# Patient Record
Sex: Male | Born: 1940 | Race: White | Hispanic: Yes | Marital: Married | Smoking: Never smoker
Health system: Southern US, Community
[De-identification: ages and names within clinical notes are randomized; demographics above are authoritative.]

## PROBLEM LIST (undated history)

## (undated) DIAGNOSIS — I639 Cerebral infarction, unspecified: Secondary | ICD-10-CM

## (undated) DIAGNOSIS — F0390 Unspecified dementia without behavioral disturbance: Secondary | ICD-10-CM

## (undated) HISTORY — PX: APPENDECTOMY: SHX54

---

## 2020-07-26 ENCOUNTER — Inpatient Hospital Stay
Admission: EM | Admit: 2020-07-26 | Discharge: 2020-07-30 | DRG: 193 | Disposition: A | Discharge status: Skilled Nursing Facility | Payer: Medicare Other | Attending: Internal Medicine | Admitting: Internal Medicine

## 2020-07-26 ENCOUNTER — Other Ambulatory Visit: Payer: Self-pay

## 2020-07-26 ENCOUNTER — Inpatient Hospital Stay: Payer: Medicare Other

## 2020-07-26 ENCOUNTER — Encounter: Payer: Self-pay | Admitting: Emergency Medicine

## 2020-07-26 ENCOUNTER — Emergency Department: Payer: Medicare Other

## 2020-07-26 DIAGNOSIS — A419 Sepsis, unspecified organism: Secondary | ICD-10-CM | POA: Diagnosis not present

## 2020-07-26 DIAGNOSIS — F0150 Vascular dementia without behavioral disturbance: Secondary | ICD-10-CM | POA: Diagnosis present

## 2020-07-26 DIAGNOSIS — J189 Pneumonia, unspecified organism: Secondary | ICD-10-CM | POA: Diagnosis present

## 2020-07-26 DIAGNOSIS — G9341 Metabolic encephalopathy: Secondary | ICD-10-CM | POA: Diagnosis present

## 2020-07-26 DIAGNOSIS — Z8673 Personal history of transient ischemic attack (TIA), and cerebral infarction without residual deficits: Secondary | ICD-10-CM

## 2020-07-26 DIAGNOSIS — G40909 Epilepsy, unspecified, not intractable, without status epilepticus: Secondary | ICD-10-CM | POA: Diagnosis present

## 2020-07-26 DIAGNOSIS — K219 Gastro-esophageal reflux disease without esophagitis: Secondary | ICD-10-CM | POA: Diagnosis present

## 2020-07-26 DIAGNOSIS — Z7901 Long term (current) use of anticoagulants: Secondary | ICD-10-CM | POA: Diagnosis not present

## 2020-07-26 DIAGNOSIS — I10 Essential (primary) hypertension: Secondary | ICD-10-CM | POA: Diagnosis present

## 2020-07-26 DIAGNOSIS — J432 Centrilobular emphysema: Secondary | ICD-10-CM | POA: Diagnosis present

## 2020-07-26 DIAGNOSIS — E785 Hyperlipidemia, unspecified: Secondary | ICD-10-CM | POA: Diagnosis present

## 2020-07-26 DIAGNOSIS — R1312 Dysphagia, oropharyngeal phase: Secondary | ICD-10-CM

## 2020-07-26 DIAGNOSIS — I639 Cerebral infarction, unspecified: Secondary | ICD-10-CM | POA: Diagnosis present

## 2020-07-26 DIAGNOSIS — F419 Anxiety disorder, unspecified: Secondary | ICD-10-CM | POA: Diagnosis present

## 2020-07-26 DIAGNOSIS — Z66 Do not resuscitate: Secondary | ICD-10-CM | POA: Diagnosis present

## 2020-07-26 DIAGNOSIS — Z79899 Other long term (current) drug therapy: Secondary | ICD-10-CM

## 2020-07-26 DIAGNOSIS — I480 Paroxysmal atrial fibrillation: Secondary | ICD-10-CM | POA: Diagnosis present

## 2020-07-26 DIAGNOSIS — Z20822 Contact with and (suspected) exposure to covid-19: Secondary | ICD-10-CM | POA: Diagnosis present

## 2020-07-26 DIAGNOSIS — F0390 Unspecified dementia without behavioral disturbance: Secondary | ICD-10-CM | POA: Diagnosis present

## 2020-07-26 DIAGNOSIS — I959 Hypotension, unspecified: Secondary | ICD-10-CM | POA: Diagnosis present

## 2020-07-26 DIAGNOSIS — R1310 Dysphagia, unspecified: Secondary | ICD-10-CM | POA: Diagnosis present

## 2020-07-26 DIAGNOSIS — G9340 Encephalopathy, unspecified: Secondary | ICD-10-CM | POA: Diagnosis not present

## 2020-07-26 DIAGNOSIS — F32A Depression, unspecified: Secondary | ICD-10-CM | POA: Diagnosis present

## 2020-07-26 HISTORY — DX: Unspecified dementia, unspecified severity, without behavioral disturbance, psychotic disturbance, mood disturbance, and anxiety: F03.90

## 2020-07-26 HISTORY — DX: Cerebral infarction, unspecified: I63.9

## 2020-07-26 LAB — HEPATIC FUNCTION PANEL
ALT: 15 U/L (ref 0–44)
AST: 30 U/L (ref 15–41)
Albumin: 3.5 g/dL (ref 3.5–5.0)
Alkaline Phosphatase: 79 U/L (ref 38–126)
Bilirubin, Direct: 0.2 mg/dL (ref 0.0–0.2)
Indirect Bilirubin: 0.8 mg/dL (ref 0.3–0.9)
Total Bilirubin: 1 mg/dL (ref 0.3–1.2)
Total Protein: 6.3 g/dL — ABNORMAL LOW (ref 6.5–8.1)

## 2020-07-26 LAB — URINALYSIS, COMPLETE (UACMP) WITH MICROSCOPIC
Bacteria, UA: NONE SEEN
Bilirubin Urine: NEGATIVE
Glucose, UA: NEGATIVE mg/dL
Hgb urine dipstick: NEGATIVE
Ketones, ur: NEGATIVE mg/dL
Leukocytes,Ua: NEGATIVE
Nitrite: NEGATIVE
Protein, ur: NEGATIVE mg/dL
Specific Gravity, Urine: 1.023 (ref 1.005–1.030)
Squamous Epithelial / HPF: NONE SEEN (ref 0–5)
pH: 6 (ref 5.0–8.0)

## 2020-07-26 LAB — BASIC METABOLIC PANEL
Anion gap: 7 (ref 5–15)
BUN: 13 mg/dL (ref 8–23)
CO2: 26 mmol/L (ref 22–32)
Calcium: 8.6 mg/dL — ABNORMAL LOW (ref 8.9–10.3)
Chloride: 102 mmol/L (ref 98–111)
Creatinine, Ser: 0.75 mg/dL (ref 0.61–1.24)
GFR, Estimated: >60 mL/min (ref >60)
Glucose, Bld: 102 mg/dL — ABNORMAL HIGH (ref 70–99)
Potassium: 3.7 mmol/L (ref 3.5–5.1)
Sodium: 135 mmol/L (ref 135–145)

## 2020-07-26 LAB — CBC
HCT: 41.6 % (ref 39.0–52.0)
Hemoglobin: 14 g/dL (ref 13.0–17.0)
MCH: 32.9 pg (ref 26.0–34.0)
MCHC: 33.7 g/dL (ref 30.0–36.0)
MCV: 97.7 fL (ref 80.0–100.0)
Platelets: 142 10*3/uL — ABNORMAL LOW (ref 150–400)
RBC: 4.26 MIL/uL (ref 4.22–5.81)
RDW: 12.8 % (ref 11.5–15.5)
WBC: 7.1 10*3/uL (ref 4.0–10.5)
nRBC: 0 % (ref 0.0–0.2)

## 2020-07-26 LAB — PROTIME-INR
INR: 1.9 — ABNORMAL HIGH (ref 0.8–1.2)
Prothrombin Time: 21 seconds — ABNORMAL HIGH (ref 11.4–15.2)

## 2020-07-26 LAB — APTT: aPTT: 37 seconds — ABNORMAL HIGH (ref 24–36)

## 2020-07-26 LAB — RESPIRATORY PANEL BY RT PCR (FLU A&B, COVID)
Influenza A by PCR: NEGATIVE
Influenza B by PCR: NEGATIVE
SARS Coronavirus 2 by RT PCR: NEGATIVE

## 2020-07-26 LAB — LACTIC ACID, PLASMA: Lactic Acid, Venous: 1 mmol/L (ref 0.5–1.9)

## 2020-07-26 MED ORDER — SODIUM CHLORIDE 0.9 % IV SOLN: INTRAVENOUS | Status: DC

## 2020-07-26 MED ORDER — IOHEXOL 9 MG/ML PO SOLN: 500.0000 mL | Freq: Once | ORAL | Status: DC

## 2020-07-26 MED ORDER — ONDANSETRON HCL 4 MG/2ML IJ SOLN: 4.0000 mg | Freq: Four times a day (QID) | INTRAMUSCULAR | Status: DC | PRN

## 2020-07-26 MED ORDER — ONDANSETRON HCL 4 MG PO TABS: 4.0000 mg | ORAL_TABLET | Freq: Four times a day (QID) | ORAL | Status: DC | PRN

## 2020-07-26 MED ORDER — SODIUM CHLORIDE 0.9 % IV SOLN: 2.0000 g | INTRAVENOUS | Status: DC

## 2020-07-26 MED ORDER — SODIUM CHLORIDE 0.9 % IV BOLUS (SEPSIS)
1000.0000 mL | Freq: Once | INTRAVENOUS | Status: AC
  Administered 2020-07-26: 1000 mL via INTRAVENOUS

## 2020-07-26 MED ORDER — ENOXAPARIN SODIUM 40 MG/0.4ML ~~LOC~~ SOLN
40.0000 mg | SUBCUTANEOUS | Status: DC
  Administered 2020-07-27: 40 mg via SUBCUTANEOUS
  Filled 2020-07-26: qty 0.4

## 2020-07-26 MED ORDER — SODIUM CHLORIDE 0.9 % IV SOLN
1000.0000 mL | Freq: Once | INTRAVENOUS | Status: AC
  Administered 2020-07-26: 1000 mL via INTRAVENOUS

## 2020-07-26 MED ORDER — SODIUM CHLORIDE 0.9 % IV SOLN
2.0000 g | INTRAVENOUS | Status: DC
  Administered 2020-07-26 – 2020-07-29 (×4): 2 g via INTRAVENOUS
  Filled 2020-07-26 (×2): qty 20
  Filled 2020-07-26 (×2): qty 2

## 2020-07-26 MED ORDER — ACETAMINOPHEN 650 MG RE SUPP: 650.0000 mg | Freq: Four times a day (QID) | RECTAL | Status: DC | PRN

## 2020-07-26 MED ORDER — SODIUM CHLORIDE 0.9 % IV SOLN: 500.0000 mg | INTRAVENOUS | Status: DC

## 2020-07-26 MED ORDER — LEVETIRACETAM IN NACL 500 MG/100ML IV SOLN
500.0000 mg | Freq: Two times a day (BID) | INTRAVENOUS | Status: DC
  Administered 2020-07-26 – 2020-07-27 (×2): 500 mg via INTRAVENOUS
  Filled 2020-07-26 (×4): qty 100

## 2020-07-26 MED ORDER — ACETAMINOPHEN 325 MG PO TABS: 650.0000 mg | ORAL_TABLET | Freq: Four times a day (QID) | ORAL | Status: DC | PRN

## 2020-07-26 MED ORDER — SODIUM CHLORIDE 0.9 % IV SOLN
500.0000 mg | INTRAVENOUS | Status: DC
  Administered 2020-07-26 – 2020-07-29 (×4): 500 mg via INTRAVENOUS
  Filled 2020-07-26 (×4): qty 500

## 2020-07-26 NOTE — ED Notes (Signed)
CT with oral contrast ordered. Patient NPO until cleared by speech. Ordered to be changed by MD

## 2020-07-26 NOTE — ED Triage Notes (Signed)
Pt comes into the ED via [facility] from home with his husband c/o increased weakness since yesterday.  Pt has h/o TIA and stroke with right side deficit.  Pt denies any pain, CP, SHOb, dizziness, N/V, etc.  Pt had soft pressures with EMS and continues at this time.  Pt alert and able to answer all triage questions at this time.  Husband had concern for strong urine odor as well.

## 2020-07-26 NOTE — H&P (Signed)
History and Physical    Duane Guzman ZOX:[id number] DOB: 01/16/1941 DOA: 07/26/2020  PCP: Patient, No Pcp Per   Patient coming from: Home  I have personally briefly reviewed patient's old medical records in [facility]  Chief Complaint: Weakness Most of the history was obtained from patient's spouse over the phone Duane Guzman  HPI: Duane Guzman is a 79 y.o. male with medical history significant for multiple CVA and vascular dementia who was brought into the ER by EMS for evaluation of weakness and difficulty with ambulation for about 2 days which his spouse said is atypical for him.  Over the last 24 hours prior to his admission the patient has been very weak and unable to ambulate which is far from his baseline of being able to ambulate with minimum assistance.  His spouse also states that he appears to be more confused than his baseline and on the morning of his presentation was unable to take his medication like he usually will and had water spilling out of his mouth.  He was concerned he may have had a TIA and so brought him to the emergency room for evaluation. I am unable to do a review of systems on this patient. When patient arrived to the ER he was hypotensive with blood pressure of 84/51 that responded to fluid resuscitation.  Per EMS he had a fever with a T-max of 100.65F. Labs show sodium 135, potassium 3.7, chloride 102, bicarb of 26, BUN 13, creatinine 0.75, calcium 8.6, alkaline phosphatase 79, albumin 3.5, AST 30, ALT 15, total protein 6.3, lactic acid 1.0, white count 7.1, hemoglobin 14, hematocrit 30, MCV 97.7, RDW 12.8 platelet count, PT 21, INR 1.9 Chest x-ray reviewed by me shows patchy left lung base opacity suspicious for pneumonia with a possible small left pleural effusion. Chest CT showed extensive centrilobular and paraseptal emphysematous changes.  Scattered areas of fibrosis and atelectasis.  No frank consolidation.  Sizable paraesophageal type hernia with  much of the stomach above the diaphragm.  Sending thoracic aortic diameter measuring 4.0 x 4.0 cm.  Annual imaging has been recommended. CT scan of the head without contrast shows multiple chronic infarcts. Twelve-lead EKG reviewed by me shows sinus rhythm with premature atrial complexes.   ED Course: Patient is an 79 year old male with a history of a CVA and dementia who presents to the ER for evaluation of worsening confusion to his baseline and weakness.  Chest x-ray shows a left lower lobe infiltrate concerning for possible aspiration.  He had a low-grade fever with a T-max of 100.65F, was hypotensive upon arrival to the ER with improvement in his blood pressure with IV fluid resuscitation.  He will be admitted to the hospital for further evaluation.  Review of Systems: As per HPI otherwise 10 point review of systems negative.    History reviewed. No pertinent past medical history.  Past Surgical History:  Procedure Laterality Date  . APPENDECTOMY       reports that he has never smoked. He has never used smokeless tobacco. No history on file for alcohol use and drug use.  No Known Allergies  History reviewed. No pertinent family history.   Prior to Admission medications   Not on File    Physical Exam: Vitals:   07/26/20 1100 07/26/20 1115 07/26/20 1130 07/26/20 1145  BP: (!) 81/59  100/72   Pulse: 77 75 76 80  Resp: 11 11 11 11   Temp:      TempSrc:  SpO2: 98% 98% 99% 99%  Weight:      Height:         Vitals:   07/26/20 1100 07/26/20 1115 07/26/20 1130 07/26/20 1145  BP: (!) 81/59  100/72   Pulse: 77 75 76 80  Resp: 11 11 11 11   Temp:      TempSrc:      SpO2: 98% 98% 99% 99%  Weight:      Height:        Constitutional: NAD, lethargic but opens eyes to verbal stimuli Eyes: PERRL, lids and conjunctivae pallor ENMT: Mucous membranes are moist.  Neck: normal, supple, no masses, no thyromegaly Respiratory:  Bilateral air entry, no wheezing, no crackles.  Normal respiratory effort. No accessory muscle use.  Cardiovascular: Regular rate and rhythm, no murmurs / rubs / gallops. No extremity edema. 2+ pedal pulses. No carotid bruits.  Abdomen: Diffusely tender, no masses palpated. No hepatosplenomegaly. Bowel sounds positive.  Musculoskeletal: no clubbing / cyanosis. No joint deformity upper and lower extremities.  Skin: no rashes, lesions, ulcers.  Neurologic: Unable to assess Psychiatric: Unable to assess   Labs on Admission: I have personally reviewed following labs and imaging studies  CBC: Recent Labs  Lab 07/26/20 0935  WBC 7.1  HGB 14.0  HCT 41.6  MCV 97.7  PLT 142*   Basic Metabolic Panel: Recent Labs  Lab 07/26/20 0935  NA 135  K 3.7  CL 102  CO2 26  GLUCOSE 102*  BUN 13  CREATININE 0.75  CALCIUM 8.6*   GFR: Estimated Creatinine Clearance: 66.5 mL/min (by C-G formula based on SCr of 0.75 mg/dL). Liver Function Tests: Recent Labs  Lab 07/26/20 1006  AST 30  ALT 15  ALKPHOS 79  BILITOT 1.0  PROT 6.3*  ALBUMIN 3.5   No results for input(s): LIPASE, AMYLASE in the last 168 hours. No results for input(s): AMMONIA in the last 168 hours. Coagulation Profile: Recent Labs  Lab 07/26/20 1006  INR 1.9*   Cardiac Enzymes: No results for input(s): CKTOTAL, CKMB, CKMBINDEX, TROPONINI in the last 168 hours. BNP (last 3 results) No results for input(s): PROBNP in the last 8760 hours. HbA1C: No results for input(s): HGBA1C in the last 72 hours. CBG: No results for input(s): GLUCAP in the last 168 hours. Lipid Profile: No results for input(s): CHOL, HDL, LDLCALC, TRIG, CHOLHDL, LDLDIRECT in the last 72 hours. Thyroid Function Tests: No results for input(s): TSH, T4TOTAL, FREET4, T3FREE, THYROIDAB in the last 72 hours. Anemia Panel: No results for input(s): VITAMINB12, FOLATE, FERRITIN, TIBC, IRON, RETICCTPCT in the last 72 hours. Urine analysis: No results found for: COLORURINE, APPEARANCEUR, LABSPEC, PHURINE,  GLUCOSEU, HGBUR, BILIRUBINUR, KETONESUR, PROTEINUR, UROBILINOGEN, NITRITE, LEUKOCYTESUR  Radiological Exams on Admission: DG Chest Port 1 View  Result Date: 07/26/2020 CLINICAL DATA:  Weakness, possible sepsis EXAM: PORTABLE CHEST 1 VIEW COMPARISON:  None. FINDINGS: Loop recorder overlies the lower left chest. Top-normal heart size. Atherosclerotic aortic arch. Otherwise normal mediastinal contour. No pneumothorax. No right pleural effusion. Mild blunting of the left costophrenic angle cannot exclude small left pleural effusion. Patchy left lung base opacity. No pulmonary edema. IMPRESSION: 1. Patchy left lung base opacity, suspicious for pneumonia, with differential including aspiration or atelectasis. PA and lateral chest radiograph follow-up advised. 2. Possible small left pleural effusion. Electronically Signed   By: 07/28/2020 M.D.   On: 07/26/2020 10:02    EKG: Independently reviewed.  Sinus rhythm with premature atrial complexes  Assessment/Plan Principal Problem:   Sepsis (HCC)  Active Problems:   CAP (community acquired pneumonia)   CVA (cerebral vascular accident) (HCC)   Dementia (HCC)     Sepsis (POA) As evidenced by fever with a T-max of 101F, hypotension with systolic blood pressure in the 80s that improved following IV fluid resuscitation and a left lower lobe infiltrate rule out possible aspiration We will place patient empirically on Rocephin and Zithromax Follow-up results of blood cultures We will keep patient n.p.o. and will request speech therapy for swallow function evaluation Continue IV fluid hydration    Community-acquired pneumonia Rule out possible aspiration pneumonia Patient is a high risk for aspiration due to his history of dementia as well as his paraesophageal hernia We will request speech therapy for swallow function evaluation   Vascular dementia Patient is lethargic Will hold clonazepam, citalopram, mirtazapine gabapentin and aripiprazole  until his mental status improves   Encephalopathy Unclear etiology but per patient's spouse this is far from his baseline where he is able to ambulate and carry on conversation May be secondary to infection or medication induced Hold all antidepressants and anxiolytics at this time    History of seizures Place patient on seizure precaution Continue Keppra 500 mg IV twice daily   History of CVA Chronic Hold Xarelto and atorvastatin for now until patient is more awake and alert  DVT prophylaxis: Lovenox Code Status: DO NOT RESUSCITATE Family Communication: Greater than 50% of time was spent discussing patient's condition and plan of care with his spouse over the phone.  All questions and concerns have been addressed.  He verbalizes understanding and agrees to the plan of care.  CODE STATUS was discussed and patient is a DO NOT RESUSCITATE.  He will want patient to go to subacute rehab prior to being discharged home. Disposition Plan: Back to previous home environment Consults called: Speech therapy    Rosselyn Martha MD [facility]     07/26/2020, 12:18 PM

## 2020-07-26 NOTE — Evaluation (Cosign Needed Addendum)
Clinical/Bedside Swallow Evaluation Patient Details  Name: Duane Guzman MRN: [id number] Date of Birth: [birth year]  Today's Date: 07/26/2020 Time: SLP Start Time (ACUTE ONLY): 1340 SLP Stop Time (ACUTE ONLY): 1420 SLP Time Calculation (min) (ACUTE ONLY): 40 min  Past Medical History:  Past Medical History:  Diagnosis Date  . Dementia (HCC)   . Stroke [facility])    Past Surgical History:  Past Surgical History:  Procedure Laterality Date  . APPENDECTOMY     HPI:  Pt is a 79 y.o. male with medical history significant for multiple CVA and vascular dementia who was brought into the ER by EMS for evaluation of weakness and difficulty with ambulation for about 2 days which his spouse said is atypical for him. Over the last 24 hours prior to his admission the patient has been very weak and unable to ambulate which is far from his baseline of being able to ambulate with minimum assistance.  His spouse also states that he appears to be more confused than his baseline and on the morning of his presentation was unable to take his medication like he usually will and had water spilling out of his mouth.  He was concerned he may have had a TIA and so brought him to the emergency room for evaluation.   Chest x-ray (10/28)  shows patchy left lung base opacity suspicious for pneumonia with a possible small left pleural effusion. Chest CT (10/28) showed extensive centrilobular and paraseptal emphysematous changes.  Scattered areas of fibrosis and atelectasis.  No frank consolidation.  Sizable paraesophageal type hernia with much of the stomach above the diaphragm.   CT scan of the head without contrast (10/28) shows multiple chronic infarcts.  Assessment / Plan / Recommendation Clinical Impression  Pt was seen bedside in the ED for bedside swallow evaluation. Pt presents w/ No immediate overt clinical s/s of aspiration w/ po trials given. Pt is at min increased risk for dysphagia d/t cognitive status (noted in  chart-- "multiple chronic infarcts"; vascular Dementia), and noted overall deliberate, slow motor movements, which impact Oral-prep stage and Oral stage of swallowing. Of note, any Oral Phase Dysphagia can increase risk for Pharyngeal Phase Dysphagia. Pt has Baseline Motor and Speech deficits s/p previous L MCA CVA; contracted RUE.   Pt was awake, alert, and verbal throughout session-- however, he appeared confused and frequently stated that he felt "that he is in the wrong room". Min-mod verbal cues required to re-orient pt to task; distractions reduced as possible. Oral mech exam revealed pt to be missing some dentition; lingual strength & ROM WFL; labial seal adequate. Pt exhibited fair volitional cough. Pt vocal quality appears normal baseline. Given 10x ice chips via spoon, 8x thin liquid via cup(multiple sips noted), 8x puree via spoon, and 8x softened solids via spoon/hand, pt exhibited No immediate, overt clinical s/s of aspiration -- O2 sats remained 100%, RR 18, HR wnl. Pt required moderate assist for feeding-- full assist for upright positioning in bed. Pt able to self-feed thin liquids Holding Cup, and some softened solids (moistened graham crackers). Of note, pt appears to have contracted R arm, further emphasizing need for feeding support at mealtime. Oral-prep stage appears min impaired d/t pt's generalized slow, deliberate oral motor movements-- however, given min verbal cues to attend to task & TIME, oral-prep is [facility]. Oral phase appears min impaired d/t min delay in A-P transfer of bolus; mastication time w/ increased texture min increased. Adequate labial seal; complete clearance of all bolus consistencies w/ Time  noted. Pt vocal quality was clear during/between all PO trials. No overt Pharyngeal Phase s/s noted-- no cough, no increased WOB/SOB-- O2 sats remained in the 99-100% range during all po trials. Pt was responsive to verbal cues to attend to, and follow through w/ swallow, as well as  followed clinicians directions in a timely manner.   Given pt's Baseline motor and speech deficits s/p previous L MCA, few missing dentition, Baseline Cognitive decline/Dementia status, and overall delayed motoric movements which appear to be impacting Oral Phase of swallow, recommend Dysphagia 3 w/ Minced Meats/gravies diet w/ thin liquids VIA CUP -- NO STRAWS. Meds in puree/applesauce (whole vs. crushed per NSG). NO STRAWS -- cup drinking beneficial to increase pt oral awareness. This diet may also increase nutritional intake for pt, due to the reduced motoric demands inherent to Mech soft diet vs. Regular(mastication effort/time). Recommend following general aspiration precautions w/ emphasis on reducing distractions, NO straws, alternating solids/liquids for complete clearance of bolus, and verbally cueing pt to attend to task as needed. MD/NSG updated. ST services to f/u toleration of diet. Aspiration Precautions/education posted in room.    SLP Visit Diagnosis: Dysphagia, unspecified (R13.10)    Aspiration Risk  Mild aspiration risk    Diet Recommendation  Dysphagia level 3 (mech soft w/ MINCED meats, gravies); Thin liquids VIA CUP -- NO STRAWS. Aspiration precautions; feeding support and setup at meals d/t RUE weakness, Cognitive decline  Medication Administration: Whole meds with puree vs need for Crushed in puree   Other  Recommendations Oral Care Recommendations: Oral care BID; before/post meals; assisted by NSG staff  Follow up Recommendations Home health SLP;Outpatient SLP;Skilled Nursing facility TBD     Frequency and Duration min 2x/week  1 week       Prognosis Prognosis for Safe Diet Advancement: Fair Barriers to Reach Goals: Cognitive deficits;Time post onset;Severity of deficits;Behavior      Swallow Study   General Date of Onset: 07/26/20 HPI: Pt is a 79 y.o. male with medical history significant for multiple CVA and vascular dementia who was brought into the ER by  EMS for evaluation of weakness and difficulty with ambulation for about 2 days which his spouse said is atypical for him.  Over the last 24 hours prior to his admission the patient has been very weak and unable to ambulate which is far from his baseline of being able to ambulate with minimum assistance.  His spouse also states that he appears to be more confused than his baseline and on the morning of his presentation was unable to take his medication like he usually will and had water spilling out of his mouth.  He was concerned he may have had a TIA and so brought him to the emergency room for evaluation. Type of Study: Bedside Swallow Evaluation Previous Swallow Assessment: n/a Diet Prior to this Study: NPO Temperature Spikes Noted: No Respiratory Status: Nasal cannula History of Recent Intubation: No Behavior/Cognition: Alert;Cooperative;Pleasant mood;Confused;Distractible;Requires cueing Oral Cavity Assessment: Within Functional Limits (possibly oral thrush) Oral Care Completed by SLP: No Oral Cavity - Dentition: Poor condition;Missing dentition (missing upper teeth) Vision: Functional for self-feeding Self-Feeding Abilities: Able to feed self;Needs assist;Needs set up Patient Positioning: Upright in bed Baseline Vocal Quality: Normal Volitional Cough: Strong (fair)    Oral/Motor/Sensory Function Overall Oral Motor/Sensory Function: Mild impairment Facial ROM: Within Functional Limits Facial Symmetry: Within Functional Limits Facial Strength: Within Functional Limits Facial Sensation: Within Functional Limits Lingual ROM: Within Functional Limits Lingual Symmetry: Within Functional Limits Lingual  Strength: Within Functional Limits Lingual Sensation: Within Functional Limits Velum: Within Functional Limits Mandible: Within Functional Limits   Ice Chips Ice chips: Within functional limits Presentation: Spoon Other Comments: 10x   Thin Liquid Thin Liquid: Within functional  limits Presentation: Straw Other Comments: 8x    Nectar Thick Nectar Thick Liquid: Not tested   Honey Thick Honey Thick Liquid: Not tested   Puree Puree: Within functional limits Presentation: Spoon Other Comments: 8x   Solid     Solid: Within functional limits Presentation: Self Fed;Spoon Other Comments: 8x      Oliver Pila  Graduate Clinician 07/26/2020,3:27 PM   The information in this patient note, response to treatment, and overall treatment plan developed has been reviewed and agreed upon by this clinician.  Jerilynn Som, MS, CCC-SLP Speech Language Pathologist Rehab Services [phone]

## 2020-07-26 NOTE — ED Provider Notes (Signed)
[facility] Emergency Department Provider Note   ____________________________________________    I have reviewed the triage vital signs and the nursing notes.   HISTORY  Chief Complaint Weakness   History limited by patient's altered mental status/dementia  HPI Duane Guzman is a 79 y.o. male with a history of CVA, dementia who presents with worsening weakness over the last several days per husband.  Husband reports he was quite weak and unable to walk 2 days ago which is atypical for him.  Has seemed more confused in the last several days as well.  No reports of fevers or chills.  Possible cough.  No vomiting, no reports of foul-smelling urine.  No new medications reported   History reviewed. No pertinent past medical history.  There are no problems to display for this patient.   Past Surgical History:  Procedure Laterality Date  . APPENDECTOMY      Prior to Admission medications   Not on File     Allergies Patient has no known allergies.  History reviewed. No pertinent family history.  Social History Social History   Tobacco Use  . Smoking status: Never Smoker  . Smokeless tobacco: Never Used  Substance Use Topics  . Alcohol use: Not on file  . Drug use: Not on file  No alcohol or drug use  Review of Systems  Constitutional: No reports of fever Eyes: No discharge ENT: No congestion Cardiovascular: Denies chest pain. Respiratory: No cough Gastrointestinal: No vomiting Genitourinary: Possible strong smelling urine Musculoskeletal: No joint swelling Skin: Negative for rash. Neurological: Negative for new focal weakness   ____________________________________________   PHYSICAL EXAM:  VITAL SIGNS: ED Triage Vitals  Enc Vitals Group     BP 07/26/20 0924 (!) 84/51     Pulse Rate 07/26/20 0924 92     Resp 07/26/20 0924 17     Temp 07/26/20 0924 98.2 F (36.8 C)     Temp Source 07/26/20 0924 Oral     SpO2 07/26/20  0924 92 %     Weight 07/26/20 0930 65.8 kg (145 lb)     Height 07/26/20 0930 1.676 m (5\' 6" )     Head Circumference --      Peak Flow --      Pain Score 07/26/20 0930 0     Pain Loc --      Pain Edu? --      Excl. in GC? --     Constitutional: Alert but disoriented Eyes: Conjunctivae are normal.  Head: Atraumatic. Nose: No congestion/rhinnorhea. Mouth/Throat: Mucous membranes are moist.   Neck:  Painless ROM Cardiovascular: Normal rate, regular rhythm.  Good peripheral circulation. Respiratory: Normal respiratory effort.  No retractions. Lungs CTAB. Gastrointestinal: Soft and nontender. No distention.  No CVA tenderness.  Musculoskeletal:   Warm and well perfused Neurologic:   No gross focal neurologic deficits are appreciated.  Skin:  Skin is warm, dry and intact. No rash noted.   ____________________________________________   LABS (all labs ordered are listed, but only abnormal results are displayed)  Labs Reviewed  BASIC METABOLIC PANEL - Abnormal; Notable for the following components:      Result Value   Glucose, Bld 102 (*)    Calcium 8.6 (*)    All other components within normal limits  CBC - Abnormal; Notable for the following components:   Platelets 142 (*)    All other components within normal limits  PROTIME-INR - Abnormal; Notable for the following components:  Prothrombin Time 21.0 (*)    INR 1.9 (*)    All other components within normal limits  APTT - Abnormal; Notable for the following components:   aPTT 37 (*)    All other components within normal limits  HEPATIC FUNCTION PANEL - Abnormal; Notable for the following components:   Total Protein 6.3 (*)    All other components within normal limits  CULTURE, BLOOD (SINGLE)  URINE CULTURE  RESPIRATORY PANEL BY RT PCR (FLU A&B, COVID)  CULTURE, BLOOD (ROUTINE X 2)  CULTURE, BLOOD (ROUTINE X 2)  LACTIC ACID, PLASMA  URINALYSIS, COMPLETE (UACMP) WITH MICROSCOPIC  LACTIC ACID, PLASMA    ____________________________________________  EKG  ED ECG REPORT I, Jene Every, the attending physician, personally viewed and interpreted this ECG.  Date: 07/26/2020  Rhythm: normal sinus rhythm QRS Axis: normal Intervals: normal ST/T Wave abnormalities: normal Narrative Interpretation: no evidence of acute ischemia  ____________________________________________  RADIOLOGY  Chest x-ray viewed by me, left-sided infiltrate, confirmed by radiology ____________________________________________   PROCEDURES  Procedure(s) performed: No  Procedures   Critical Care performed: No ____________________________________________   INITIAL IMPRESSION / ASSESSMENT AND PLAN / ED COURSE  Pertinent labs & imaging results that were available during my care of the patient were reviewed by me and considered in my medical decision making (see chart for details).  Patient presents with gradual worsening weakness, increased confusion per partner.  Afebrile here in the emergency department but suspicious for infectious etiology with metabolic encephalopathy.  No focal neuro deficits to suggest CVA.  Lab work is overall reassuring, normal white blood cell count, normal lactic acid.  Chest x-ray reviewed by me, demonstrates left-sided infiltrate consistent with pneumonia, I suspect this is the cause of his weakness and altered mental status.  We will start Rocephin, azithromycin, code sepsis activated  Have ordered 30 mils per kilogram of IV fluids  Sepsis - Repeat Assessment  Performed at:    1150  Vitals     Blood pressure 95/65, pulse 80, temperature 98.2 F (36.8 C), temperature source Oral, resp. rate 10, height 1.676 m (5\' 6" ), weight 65.8 kg, SpO2 96 %.  Heart:     Regular rate and rhythm  Lungs:    CTA  Capillary Refill:   <2 sec  Peripheral Pulse:   Radial pulse palpable  Skin:     Normal Color     We will admit to the hospitalist service.     ____________________________________________   FINAL CLINICAL IMPRESSION(S) / ED DIAGNOSES  Final diagnoses:  Community acquired pneumonia of left lung, unspecified part of lung  Acute metabolic encephalopathy        Note:  This document was prepared using Dragon voice recognition software and may include unintentional dictation errors.   , MD 07/26/20 1152

## 2020-07-26 NOTE — Plan of Care (Signed)

## 2020-07-26 NOTE — Progress Notes (Signed)
CODE SEPSIS - PHARMACY COMMUNICATION  **Broad Spectrum Antibiotics should be administered within 1 hour of Sepsis diagnosis**  Time Code Sepsis Called/Page Received: @ 1020  Antibiotics Ordered: ceftriaxone and Azithromycin   Time of 1st antibiotic administration: @ 1044  Additional action taken by pharmacy: N/A  If necessary, Name of Provider/Nurse Contacted: N/A   Gardner Candle, PharmD, BCPS Clinical Pharmacist 07/26/2020 10:21 AM

## 2020-07-26 NOTE — ED Notes (Addendum)
Interpreter requested. BIB [facility] from home due to increased weakness beginning yesterday with questionable facial droop reported by family. Pt with hx of TIA and right side flaccid/contracted at baseline per EMS. 100.1 temp with EMS, 96 on RA, HR of 70, 100/54 BP. Allergy to shrimp. Hx of dementia. Awaiting partner. EMS denies any facial droop currently.

## 2020-07-27 ENCOUNTER — Encounter: Payer: Self-pay | Admitting: Internal Medicine

## 2020-07-27 DIAGNOSIS — G9340 Encephalopathy, unspecified: Secondary | ICD-10-CM

## 2020-07-27 LAB — BLOOD CULTURE ID PANEL (REFLEXED) - BCID2

## 2020-07-27 LAB — URINE CULTURE: Culture: NO GROWTH

## 2020-07-27 MED ORDER — MIRTAZAPINE 7.5 MG PO TABS
3.7500 mg | ORAL_TABLET | Freq: Every day | ORAL | Status: DC
  Administered 2020-07-28 – 2020-07-30 (×3): 3.75 mg via ORAL
  Filled 2020-07-27 (×3): qty 1

## 2020-07-27 MED ORDER — LEVETIRACETAM 500 MG PO TABS
500.0000 mg | ORAL_TABLET | Freq: Every morning | ORAL | Status: DC
  Administered 2020-07-27 – 2020-07-30 (×4): 500 mg via ORAL
  Filled 2020-07-27 (×4): qty 1

## 2020-07-27 MED ORDER — CITALOPRAM HYDROBROMIDE 20 MG PO TABS: 20.0000 mg | ORAL_TABLET | Freq: Every day | ORAL | Status: DC

## 2020-07-27 MED ORDER — PANTOPRAZOLE SODIUM 40 MG PO TBEC
40.0000 mg | DELAYED_RELEASE_TABLET | Freq: Every day | ORAL | Status: DC
  Administered 2020-07-28 – 2020-07-30 (×3): 40 mg via ORAL
  Filled 2020-07-27 (×3): qty 1

## 2020-07-27 MED ORDER — GABAPENTIN 300 MG PO CAPS
300.0000 mg | ORAL_CAPSULE | Freq: Every evening | ORAL | Status: DC
  Administered 2020-07-27 – 2020-07-28 (×2): 300 mg via ORAL
  Filled 2020-07-27 (×3): qty 1

## 2020-07-27 MED ORDER — VITAMIN D (ERGOCALCIFEROL) 1.25 MG (50000 UNIT) PO CAPS
50000.0000 [IU] | ORAL_CAPSULE | ORAL | Status: DC
  Administered 2020-07-30: 50000 [IU] via ORAL
  Filled 2020-07-27: qty 1

## 2020-07-27 MED ORDER — ARIPIPRAZOLE 5 MG PO TABS
2.5000 mg | ORAL_TABLET | Freq: Every day | ORAL | Status: DC
  Administered 2020-07-27 – 2020-07-30 (×4): 2.5 mg via ORAL
  Filled 2020-07-27 (×4): qty 1

## 2020-07-27 MED ORDER — ARIPIPRAZOLE 5 MG PO TABS: 2.5000 mg | ORAL_TABLET | Freq: Every day | ORAL | Status: DC

## 2020-07-27 MED ORDER — ATORVASTATIN CALCIUM 10 MG PO TABS
10.0000 mg | ORAL_TABLET | Freq: Every day | ORAL | Status: DC
  Administered 2020-07-27 – 2020-07-29 (×3): 10 mg via ORAL
  Filled 2020-07-27 (×3): qty 1

## 2020-07-27 MED ORDER — RIVAROXABAN 20 MG PO TABS
20.0000 mg | ORAL_TABLET | Freq: Every evening | ORAL | Status: DC
  Administered 2020-07-27 – 2020-07-28 (×2): 20 mg via ORAL
  Filled 2020-07-27 (×3): qty 1

## 2020-07-27 MED ORDER — CITALOPRAM HYDROBROMIDE 20 MG PO TABS
20.0000 mg | ORAL_TABLET | Freq: Every day | ORAL | Status: DC
  Administered 2020-07-27 – 2020-07-30 (×4): 20 mg via ORAL
  Filled 2020-07-27 (×4): qty 1

## 2020-07-27 MED ORDER — LEVETIRACETAM 500 MG PO TABS
1000.0000 mg | ORAL_TABLET | Freq: Every day | ORAL | Status: DC
  Administered 2020-07-27 – 2020-07-29 (×3): 1000 mg via ORAL
  Filled 2020-07-27 (×3): qty 2

## 2020-07-27 NOTE — Plan of Care (Signed)

## 2020-07-27 NOTE — Progress Notes (Signed)
PHARMACY - PHYSICIAN COMMUNICATION CRITICAL VALUE ALERT - BLOOD CULTURE IDENTIFICATION (BCID)  Duane Guzman is an 79 y.o. male who presented to [facility] on [facility]/26/2020 with a chief complaint of weakness  Assessment:  Lab reports 1 of 2 bottles + for staph species, Staph Epi mecA/C detected  Name of physician (or Provider) Contacted: Reyes Ivan NP  Current antibiotics: Zithromax and Rocephin, WBC normal, afebrile  Changes to prescribed antibiotics recommended: possible contamination, no changes recommended, continue to monitor for possible adjustment, no reports of fever or chills Recommendations accepted by provider  Results for orders placed or performed during the hospital encounter of 07/26/20  Blood Culture ID Panel (Reflexed) (Collected: 07/26/2020 10:06 AM)  Result Value Ref Range   Enterococcus faecalis NOT DETECTED NOT DETECTED   Enterococcus Faecium NOT DETECTED NOT DETECTED   Listeria monocytogenes NOT DETECTED NOT DETECTED   Staphylococcus species DETECTED (A) NOT DETECTED   Staphylococcus aureus (BCID) NOT DETECTED NOT DETECTED   Staphylococcus epidermidis DETECTED (A) NOT DETECTED   Staphylococcus lugdunensis NOT DETECTED NOT DETECTED   Streptococcus species NOT DETECTED NOT DETECTED   Streptococcus agalactiae NOT DETECTED NOT DETECTED   Streptococcus pneumoniae NOT DETECTED NOT DETECTED   Streptococcus pyogenes NOT DETECTED NOT DETECTED   A.calcoaceticus-baumannii NOT DETECTED NOT DETECTED   Bacteroides fragilis NOT DETECTED NOT DETECTED   Enterobacterales NOT DETECTED NOT DETECTED   Enterobacter cloacae complex NOT DETECTED NOT DETECTED   Escherichia coli NOT DETECTED NOT DETECTED   Klebsiella aerogenes NOT DETECTED NOT DETECTED   Klebsiella oxytoca NOT DETECTED NOT DETECTED   Klebsiella pneumoniae NOT DETECTED NOT DETECTED   Proteus species NOT DETECTED NOT DETECTED   Salmonella species NOT DETECTED NOT DETECTED   Serratia marcescens NOT DETECTED NOT DETECTED    Haemophilus influenzae NOT DETECTED NOT DETECTED   Neisseria meningitidis NOT DETECTED NOT DETECTED   Pseudomonas aeruginosa NOT DETECTED NOT DETECTED   Stenotrophomonas maltophilia NOT DETECTED NOT DETECTED   Candida albicans NOT DETECTED NOT DETECTED   Candida auris NOT DETECTED NOT DETECTED   Candida glabrata NOT DETECTED NOT DETECTED   Candida krusei NOT DETECTED NOT DETECTED   Candida parapsilosis NOT DETECTED NOT DETECTED   Candida tropicalis NOT DETECTED NOT DETECTED   Cryptococcus neoformans/gattii NOT DETECTED NOT DETECTED   Methicillin resistance mecA/C DETECTED (A) NOT DETECTED    Valrie Hart A 07/27/2020  6:45 AM

## 2020-07-27 NOTE — Progress Notes (Addendum)
Progress Note    Duane LynnRafael Guzman  EAV:[id number]:[id number] DOB: August 26, 1941  DOA: 07/26/2020 PCP: Patient, No Pcp Per      Brief Narrative:    Medical records reviewed and are as summarized below:  Duane Guzman is a 79 y.o. male       Assessment/Plan:   Principal Problem:   Sepsis (HCC) Active Problems:   CAP (community acquired pneumonia)   CVA (cerebral vascular accident) (HCC)   Dementia (HCC)   Encephalopathy acute   Body mass index is 23.92 kg/m.   Sepsis secondary to pneumonia: Continue empiric IV antibiotics.  1 out of 4 blood culture bottles showed staph epidermidis.  This is likely a contaminant.  Repeat blood culture tomorrow.  Discontinue IV fluids.  Acute metabolic encephalopathy with underlying dementia: Supportive care.  Continue psychotropics  Paroxysmal atrial fibrillation and history of stroke: Continue Xarelto  Seizure disorder: Continue Keppra and gabapentin  Dysphagia: Speech therapist recommended dysphagia 3 diet  Other comorbidities include hypertension, hyperlipidemia, depression, anxiety, GERD, hiatal hernia.    Diet Order            DIET DYS 3 Room service appropriate? Yes with Assist; Fluid consistency: Thin  Diet effective now                    Consultants:  None  Procedures:  None    Medications:   . ARIPiprazole  2.5 mg Oral Q1200  . atorvastatin  10 mg Oral QHS  . citalopram  20 mg Oral Daily  . gabapentin  300 mg Oral QPM  . iohexol  500 mL Oral Once  . levETIRAcetam  500-1,000 mg Oral See admin instructions  . [START ON 07/28/2020] mirtazapine  3.75 mg Oral Daily  . [START ON 07/28/2020] pantoprazole  40 mg Oral Q0600  . rivaroxaban  20 mg Oral QPM  . [START ON 07/30/2020] Vitamin D (Ergocalciferol)  50,000 Units Oral Q Mon   Continuous Infusions: . azithromycin 500 mg (07/27/20 1120)  . cefTRIAXone (ROCEPHIN)  IV 2 g (07/27/20 1029)     Anti-infectives (From admission, onward)   Start     Dose/Rate  Route Frequency Ordered Stop   07/27/20 1100  cefTRIAXone (ROCEPHIN) 2 g in sodium chloride 0.9 % 100 mL IVPB  Status:  Discontinued        2 g 200 mL/hr over 30 Minutes Intravenous Every 24 hours 07/26/20 1404 07/26/20 1408   07/27/20 1100  azithromycin (ZITHROMAX) 500 mg in sodium chloride 0.9 % 250 mL IVPB  Status:  Discontinued        500 mg 250 mL/hr over 60 Minutes Intravenous Every 24 hours 07/26/20 1404 07/26/20 1408   07/26/20 1030  cefTRIAXone (ROCEPHIN) 2 g in sodium chloride 0.9 % 100 mL IVPB        2 g 200 mL/hr over 30 Minutes Intravenous Every 24 hours 07/26/20 1018     07/26/20 1030  azithromycin (ZITHROMAX) 500 mg in sodium chloride 0.9 % 250 mL IVPB        500 mg 250 mL/hr over 60 Minutes Intravenous Every 24 hours 07/26/20 1018               Family Communication/Anticipated D/C date and plan/Code Status   DVT prophylaxis:  rivaroxaban (XARELTO) tablet 20 mg     Code Status: DNR  Family Communication:  Disposition Plan:    Status is: Inpatient  Remains inpatient appropriate because:IV treatments appropriate due to intensity of illness  or inability to take PO and Inpatient level of care appropriate due to severity of illness   Dispo: The patient is from: Home              Anticipated d/c is to: Home              Anticipated d/c date is: 3 days              Patient currently is not medically stable to d/c.           Subjective:   Interval events noted.  He is confused and unable to provide any history.  Objective:    Vitals:   07/27/20 0005 07/27/20 0427 07/27/20 0821 07/27/20 1128  BP: 117/71 121/76 129/85 118/74  Pulse: 68 72 72 76  Resp: 16 18 19 18   Temp: 97.6 F (36.4 C) 97.9 F (36.6 C) 98 F (36.7 C) 97.9 F (36.6 C)  TempSrc: Oral Oral    SpO2: 100% 97% 97% 95%  Weight:  67.2 kg    Height:       No data found.   Intake/Output Summary (Last 24 hours) at 07/27/2020 1425 Last data filed at 07/27/2020 0537 Gross per  24 hour  Intake 1364.49 ml  Output 50 ml  Net 1314.49 ml   Filed Weights   07/26/20 0930 07/27/20 0427  Weight: 65.8 kg 67.2 kg    Exam:  GEN: NAD SKIN: Warm and dry  EYES: No pallor or icterus ENT: MMM CV: RRR PULM: CTA B ABD: soft, ND, NT, +BS CNS: AAO x 1 (person), right upper extremity weakness with contractures.  He moves all other extremities spontaneously.  He is confused and does not follow commands. EXT: No edema or tenderness   Data Reviewed:   I have personally reviewed following labs and imaging studies:  Labs: Labs show the following:   Basic Metabolic Panel: Recent Labs  Lab 07/26/20 0935  NA 135  K 3.7  CL 102  CO2 26  GLUCOSE 102*  BUN 13  CREATININE 0.75  CALCIUM 8.6*   GFR Estimated Creatinine Clearance: 66.5 mL/min (by C-G formula based on SCr of 0.75 mg/dL). Liver Function Tests: Recent Labs  Lab 07/26/20 1006  AST 30  ALT 15  ALKPHOS 79  BILITOT 1.0  PROT 6.3*  ALBUMIN 3.5   No results for input(s): LIPASE, AMYLASE in the last 168 hours. No results for input(s): AMMONIA in the last 168 hours. Coagulation profile Recent Labs  Lab 07/26/20 1006  INR 1.9*    CBC: Recent Labs  Lab 07/26/20 0935  WBC 7.1  HGB 14.0  HCT 41.6  MCV 97.7  PLT 142*   Cardiac Enzymes: No results for input(s): CKTOTAL, CKMB, CKMBINDEX, TROPONINI in the last 168 hours. BNP (last 3 results) No results for input(s): PROBNP in the last 8760 hours. CBG: No results for input(s): GLUCAP in the last 168 hours. D-Dimer: No results for input(s): DDIMER in the last 72 hours. Hgb A1c: No results for input(s): HGBA1C in the last 72 hours. Lipid Profile: No results for input(s): CHOL, HDL, LDLCALC, TRIG, CHOLHDL, LDLDIRECT in the last 72 hours. Thyroid function studies: No results for input(s): TSH, T4TOTAL, T3FREE, THYROIDAB in the last 72 hours.  Invalid input(s): FREET3 Anemia work up: No results for input(s): VITAMINB12, FOLATE, FERRITIN,  TIBC, IRON, RETICCTPCT in the last 72 hours. Sepsis Labs: Recent Labs  Lab 07/26/20 0935 07/26/20 1006  WBC 7.1  --   LATICACIDVEN  --  1.0    Microbiology Recent Results (from the past 240 hour(s))  Urine culture     Status: None   Collection Time: 07/26/20  9:35 AM   Specimen: In/Out Cath Urine  Result Value Ref Range Status   Specimen Description   Final    IN/OUT CATH URINE Performed at [facility], [address] [place], [place] [address]    Special Requests   Final    NONE Performed at [facility], [address] [place], [place] [address]    Culture   Final    NO GROWTH Performed at [facility], [address], [place], [place] [address]    Report Status 07/27/2020 FINAL  Final  Blood culture (routine single)     Status: None (Preliminary result)   Collection Time: 07/26/20 10:06 AM   Specimen: BLOOD  Result Value Ref Range Status   Specimen Description BLOOD BLOOD RIGHT HAND  Final   Special Requests   Final    BOTTLES DRAWN AEROBIC AND ANAEROBIC Blood Culture results may not be optimal due to an inadequate volume of blood received in culture bottles   Culture  Setup Time   Final    GRAM POSITIVE COCCI ANAEROBIC BOTTLE ONLY Organism ID to follow CRITICAL RESULT CALLED TO, READ BACK BY AND VERIFIED WITH: SCOTT HALL AT 0631 ON 07/27/2020 [facility]. Performed at [facility], [address] [place], [place] [address]    Culture [facility] POSITIVE COCCI  Final   Report Status PENDING  Incomplete  Blood culture (routine x 2)     Status: None (Preliminary result)   Collection Time: 07/26/20 10:06 AM   Specimen: BLOOD  Result Value Ref Range Status   Specimen Description BLOOD LEFT ANTECUBITAL  Final   Special Requests   Final    BOTTLES DRAWN AEROBIC AND ANAEROBIC Blood Culture adequate volume   Culture   Final    NO GROWTH < 24 HOURS Performed at [facility], [address], [place], [place] [address]    Report  Status PENDING  Incomplete  Blood Culture ID Panel (Reflexed)     Status: Abnormal   Collection Time: 07/26/20 10:06 AM  Result Value Ref Range Status   Enterococcus faecalis NOT DETECTED NOT DETECTED Final   Enterococcus Faecium NOT DETECTED NOT DETECTED Final   Listeria monocytogenes NOT DETECTED NOT DETECTED Final   Staphylococcus species DETECTED (A) NOT DETECTED Final    Comment: CRITICAL RESULT CALLED TO, READ BACK BY AND VERIFIED WITH: SCOTT HALL AT 0631 ON 07/27/2020 [facility].    Staphylococcus aureus (BCID) NOT DETECTED NOT DETECTED Final   Staphylococcus epidermidis DETECTED (A) NOT DETECTED Final    Comment: Methicillin (oxacillin) resistant coagulase negative staphylococcus. Possible blood culture contaminant (unless isolated from more than one blood culture draw or clinical case suggests pathogenicity). No antibiotic treatment is indicated for blood  culture contaminants. CRITICAL RESULT CALLED TO, READ BACK BY AND VERIFIED WITH: SCOTT HALL AT 0631 ON 07/27/2020 [facility].    Staphylococcus lugdunensis NOT DETECTED NOT DETECTED Final   Streptococcus species NOT DETECTED NOT DETECTED Final   Streptococcus agalactiae NOT DETECTED NOT DETECTED Final   Streptococcus pneumoniae NOT DETECTED NOT DETECTED Final   Streptococcus pyogenes NOT DETECTED NOT DETECTED Final   A.calcoaceticus-baumannii NOT DETECTED NOT DETECTED Final   Bacteroides fragilis NOT DETECTED NOT DETECTED Final   Enterobacterales NOT DETECTED NOT DETECTED Final   Enterobacter cloacae complex NOT DETECTED NOT DETECTED Final   Escherichia coli  NOT DETECTED NOT DETECTED Final   Klebsiella aerogenes NOT DETECTED NOT DETECTED Final   Klebsiella oxytoca NOT DETECTED NOT DETECTED Final   Klebsiella pneumoniae NOT DETECTED NOT DETECTED Final   Proteus species NOT DETECTED NOT DETECTED Final   Salmonella species NOT DETECTED NOT DETECTED Final   Serratia marcescens NOT DETECTED NOT DETECTED Final   Haemophilus influenzae NOT  DETECTED NOT DETECTED Final   Neisseria meningitidis NOT DETECTED NOT DETECTED Final   Pseudomonas aeruginosa NOT DETECTED NOT DETECTED Final   Stenotrophomonas maltophilia NOT DETECTED NOT DETECTED Final   Candida albicans NOT DETECTED NOT DETECTED Final   Candida auris NOT DETECTED NOT DETECTED Final   Candida glabrata NOT DETECTED NOT DETECTED Final   Candida krusei NOT DETECTED NOT DETECTED Final   Candida parapsilosis NOT DETECTED NOT DETECTED Final   Candida tropicalis NOT DETECTED NOT DETECTED Final   Cryptococcus neoformans/gattii NOT DETECTED NOT DETECTED Final   Methicillin resistance mecA/C DETECTED (A) NOT DETECTED Final    Comment: CRITICAL RESULT CALLED TO, READ BACK BY AND VERIFIED WITH: SCOTT HALL AT 0631 ON 07/27/2020 [facility]. Performed at [facility] [address] [place], [place] [address]   Respiratory Panel by RT PCR (Flu A&B, Covid) -     Status: None   Collection Time: 07/26/20 12:01 PM   Specimen: Nasopharyngeal  Result Value Ref Range Status   SARS Coronavirus 2 by RT PCR NEGATIVE NEGATIVE Final    Comment: (NOTE) SARS-CoV-2 target nucleic acids are NOT DETECTED.  The SARS-CoV-2 RNA is generally detectable in upper respiratoy specimens during the acute phase of infection. The lowest concentration of SARS-CoV-2 viral copies this assay can detect is 131 copies/mL. A negative result does not preclude SARS-Cov-2 infection and should not be used as the sole basis for treatment or other patient management decisions. A negative result may occur with  improper specimen collection/handling, submission of specimen other than nasopharyngeal swab, presence of viral mutation(s) within the areas targeted by this assay, and inadequate number of viral copies (<131 copies/mL). A negative result must be combined with clinical observations, patient history, and epidemiological information. The expected result is Negative.  Fact Sheet for Patients:    https://www.moore.com/  Fact Sheet for Healthcare Providers:  https://www.young.biz/  This test is no t yet approved or cleared by the [place] FDA and  has been authorized for detection and/or diagnosis of SARS-CoV-2 by FDA under an Emergency Use Authorization (EUA). This EUA will remain  in effect (meaning this test can be used) for the duration of the COVID-19 declaration under Section 564(b)(1) of the Act, 21 U.S.C. section 360bbb-3(b)(1), unless the authorization is terminated or revoked sooner.     Influenza A by PCR NEGATIVE NEGATIVE Final   Influenza B by PCR NEGATIVE NEGATIVE Final    Comment: (NOTE) The Xpert Xpress SARS-CoV-2/FLU/RSV assay is intended as an aid in  the diagnosis of influenza from Nasopharyngeal swab specimens and  should not be used as a sole basis for treatment. Nasal washings and  aspirates are unacceptable for Xpert Xpress SARS-CoV-2/FLU/RSV  testing.  Fact Sheet for Patients: https://www.moore.com/  Fact Sheet for Healthcare Providers: https://www.young.biz/  This test is not yet approved or cleared by the [place] FDA and  has been authorized for detection and/or diagnosis of SARS-CoV-2 by  FDA under an Emergency Use Authorization (EUA). This EUA will remain  in effect (meaning this test can be used) for the duration of the  Covid-19 declaration under Section 564(b)(1) of  the Act, 21  U.S.C. section 360bbb-3(b)(1), unless the authorization is  terminated or revoked. Performed at [facility], [address] [place], [place] [address]     Procedures and diagnostic studies:  CT ABDOMEN PELVIS WO CONTRAST  Result Date: 07/26/2020 CLINICAL DATA:  Acute nonlocalized abdominal pain.  Weakness. EXAM: CT ABDOMEN AND PELVIS WITHOUT CONTRAST TECHNIQUE: Multidetector CT imaging of the abdomen and pelvis was performed following the standard protocol  without IV contrast. COMPARISON:  None. FINDINGS: Lower chest: Patchy atelectasis and or infiltrate in both lower lungs. Small amount of pleural fluid left more than right. Extensive coronary artery calcification. Hiatal hernia containing fat and portions of the stomach. Hepatobiliary: Normal without contrast. Pancreas: Normal Spleen: Normal Adrenals/Urinary Tract: Adrenal glands are normal. Kidneys are normal except for a simple appearing cyst at the lower pole on the right measuring 2.5 cm in diameter. No hydronephrosis, stone or mass. Small bladder diverticula. Stomach/Bowel: Hiatal hernia as noted above containing portions of the stomach. No sign of obstruction. No small bowel pathology is evident. No acute or significant: Finding. Diverticulosis without CT evidence of diverticulitis. Vascular/Lymphatic: Aortic atherosclerosis. No aneurysm. IVC is normal. No retroperitoneal adenopathy. Reproductive: Normal Other: Small amount of intraperitoneal fluid without loculation. No free air. Musculoskeletal: Chronic curvature in degenerative changes of the spine. IMPRESSION: 1. Patchy atelectasis and or infiltrate in both lower lungs. Small amount of pleural fluid left more than right. 2. Hiatal hernia containing fat and portions of the stomach. No sign of obstruction. 3. Aortic atherosclerosis. Coronary artery calcification. 4. Diverticulosis without CT evidence of diverticulitis. Low level diverticulitis can be inapparent at imaging. 5. Small amount of intraperitoneal fluid without loculation. Aortic Atherosclerosis (ICD10-I70.0). Electronically Signed   By: Paulina Fusi M.D.   On: 07/26/2020 13:49   CT HEAD WO CONTRAST  Result Date: 07/26/2020 CLINICAL DATA:  Worsening weakness EXAM: CT HEAD WITHOUT CONTRAST TECHNIQUE: Contiguous axial images were obtained from the base of the skull through the vertex without intravenous contrast. COMPARISON:  None. FINDINGS: Brain: There is no acute intracranial hemorrhage, mass  effect, or edema. No acute appearing loss of gray-white differentiation. There are multiple chronic infarcts including involvement of the left frontal, parietal, and occipital lobes, right occipital lobe, and left lateral cerebellum. Additional confluent areas hypoattenuation in the supratentorial white nonspecific but probably reflect moderate chronic microvascular ischemic changes. Prominence of the ventricles and sulci reflects parenchymal volume loss. There is no extra-axial fluid collection. Vascular: There is atherosclerotic calcification at the skull base. Skull: Calvarium is unremarkable. Sinuses/Orbits: No acute finding. Other: None. IMPRESSION: No acute intracranial abnormality. Multiple chronic infarcts. As there is no comparison available, subtle superimposed acute infarct difficult to exclude. Electronically Signed   By: Guadlupe Spanish M.D.   On: 07/26/2020 13:49   CT CHEST WO CONTRAST  Result Date: 07/26/2020 CLINICAL DATA:  Pneumonia EXAM: CT CHEST WITHOUT CONTRAST TECHNIQUE: Multidetector CT imaging of the chest was performed following the standard protocol without IV contrast. COMPARISON:  Chest radiograph July 26, 2020. FINDINGS: Cardiovascular: Ascending thoracic aortic diameter measures 4.0 x 4.0 cm. There are scattered foci of calcification in proximal visualized great vessels. Note that the right innominate and left common carotid arteries arise as a common trunk, an anatomic variant. There is aortic atherosclerosis as well as multiple foci of coronary artery calcification. No appreciable pericardial effusion or pericardial thickening. Mediastinum/Nodes: Thyroid appears unremarkable. There is no appreciable lymph node enlargement in the thoracic region. Several subcentimeter mediastinal lymph nodes are seen which  do not meet size criteria for pathologic significance. There is a fairly sizable paraesophageal hernia with much of the stomach above the diaphragm. Lungs/Pleura: There is  underlying centrilobular and paraseptal emphysematous change. There are scattered areas of fibrosis in the periphery of the lungs bilaterally. There is patchy atelectatic change associated with scarring and fibrosis in the lung bases. There is no appreciable airspace consolidation. There is mild lower lobe bronchiectatic change. No appreciable pleural effusions. Upper Abdomen: There is upper abdominal aortic atherosclerosis. Visualized upper abdominal structures otherwise appear unremarkable. Musculoskeletal: There is degenerative change in the thoracic spine. There are no blastic or lytic bone lesions. Apparent loop recorder on the left anteriorly. IMPRESSION: 1. Extensive centrilobular and paraseptal emphysematous changes. Scattered areas of fibrosis and atelectasis. No frank consolidation. Mild lower lobe bronchiectatic change. It should be noted that subtle infiltrate in the areas of scarring and atelectasis in the lung bases is difficult to exclude with certainty. 2.  No evident adenopathy. 3. Sizable paraesophageal type hernia with much of the stomach above the diaphragm. 4. Ascending thoracic aortic diameter measures 4.0 x 4.0 cm. Recommend annual imaging followup by CTA or MRA. This recommendation follows 2010 ACCF/AHA/AATS/ACR/ASA/SCA/SCAI/SIR/STS/SVM Guidelines for the Diagnosis and Management of Patients with Thoracic Aortic Disease. Circulation. 2010; 121: H846-N629. Aortic aneurysm NOS (ICD10-I71.9). Foci of aortic atherosclerosis as well as foci of great vessel and coronary artery calcification noted. Aortic Atherosclerosis (ICD10-I70.0) and Emphysema (ICD10-J43.9). Electronically Signed   By: Bretta Bang III M.D.   On: 07/26/2020 12:41   DG Chest Port 1 View  Result Date: 07/26/2020 CLINICAL DATA:  Weakness, possible sepsis EXAM: PORTABLE CHEST 1 VIEW COMPARISON:  None. FINDINGS: Loop recorder overlies the lower left chest. Top-normal heart size. Atherosclerotic aortic arch. Otherwise  normal mediastinal contour. No pneumothorax. No right pleural effusion. Mild blunting of the left costophrenic angle cannot exclude small left pleural effusion. Patchy left lung base opacity. No pulmonary edema. IMPRESSION: 1. Patchy left lung base opacity, suspicious for pneumonia, with differential including aspiration or atelectasis. PA and lateral chest radiograph follow-up advised. 2. Possible small left pleural effusion. Electronically Signed   By: Delbert Phenix M.D.   On: 07/26/2020 10:02               LOS: 1 day   Inger Wiest  [facility]   Pager on [email or web address]. If 7PM-7AM, please contact night-coverage at [email or web address]     07/27/2020, 2:25 PM

## 2020-07-27 NOTE — Progress Notes (Signed)
Speech Language Pathology Treatment: Dysphagia  Patient Details Name: Duane Guzman MRN: [id number] DOB: 06-09-1941 Today's Date: 07/27/2020 Time: 7124-5809 SLP Time Calculation (min) (ACUTE ONLY): 40 min  Assessment / Plan / Recommendation Clinical Impression  Pt seen for ongoing assessment of swallowing. He presents w/ quick engagement and is verbally responsive; able to follow instructions w/ min+ cues. Pt is on [place] 2L; wbc wnl. 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 Slow self-feeding noted; slow motor movements. Pt has Baseline Dementia per chart dxs also. Pt is at min+ increased risk for dysphagia d/t deficits from prior L CVA, decline Cognitive status (noted in chart-- "multiple chronic infarcts"; vascular Dementia), and overall deliberate, slow motor movements which impact Oral-prep stage and Oral stage of swallowing. Of note, any Oral Phase Dysphagia can increase risk for Pharyngeal Phase Dysphagia. Pt explained general aspiration precautions and agreed verbally to the need for following them especially sitting upright for all oral intake. Pt assisted w/ positioning d/t RUE weakness then given trials of thin via Cup - pt self fed w/ setup. NO immediate, overt clinical s/s of aspiration were noted w/ trials; respiratory status remained calm and unlabored, vocal quality clear b/t trials. NO straws were utiilized for better oral and bolus control. Oral phase appeared grossly [facility] for bolus management and timely A-P transfer for swallowing; oral clearing achieved w/ trials. Pt was given education on aspiration precautions: to include Small Sips Slowly; sittign fully upright for all eating/drinking; NO Straws.   Recommend continue a Dysphagia level 3 diet (mech soft w/ MINCED meats for ease of mastication d/t missing Dentiion) w/ gravies added to moisten foods; Thin liquids -- NO STRAWS. Recommend general aspiration precautions; Pills  Whole vs Crushed in Puree; tray setup and positioning assistance for meals; monitoring to assist at meals as needed. ST services will continue to f/u w/ pt for toleration of diet and education as needed while admitted. NSG updated. Precautions posted at bedside.     HPI HPI: Pt is a 79 y.o. male with medical history significant for multiple CVA and vascular dementia who was brought into the ER by EMS for evaluation of weakness and difficulty with ambulation for about 2 days which his spouse said is atypical for him.  Over the last 24 hours prior to his admission the patient has been very weak and unable to ambulate which is far from his baseline of being able to ambulate with minimum assistance.  His spouse also states that he appears to be more confused than his baseline and on the morning of his presentation was unable to take his medication like he usually will and had water spilling out of his mouth.  He was concerned he may have had a TIA and so brought him to the emergency room for evaluation.      SLP Plan  Continue with current plan of care       Recommendations  Diet recommendations: Dysphagia 3 (mechanical soft);Thin liquid (meats minced w/ gravy) Liquids provided via: Cup;No straw Medication Administration: Whole meds with puree (for safer swallowing) Supervision: Patient able to self feed;Intermittent supervision to cue for compensatory strategies;Staff to assist with self feeding (support d/t RUE weakness) Compensations: Minimize environmental distractions;Slow rate;Small sips/bites;Lingual sweep for clearance of pocketing;Multiple dry swallows after each bite/sip;Follow solids with liquid Postural Changes and/or Swallow Maneuvers: Seated upright 90 degrees;Upright 30-60 min after meal  General recommendations:  (Dietician f/u) Oral Care Recommendations: Oral care BID;Oral care before and after PO;Staff/trained caregiver to provide oral care Follow up  Recommendations: Home health SLP;Skilled Nursing facility (TBD) SLP Visit Diagnosis: Dysphagia, oropharyngeal phase (R13.12) (baseline CVA w/ residual deficits) Plan: Continue with current plan of care       GO                 Jerilynn Som, MS, CCC-SLP Speech Language Pathologist Rehab Services [phone] [facility] 07/27/2020, 2:37 PM

## 2020-07-28 DIAGNOSIS — J189 Pneumonia, unspecified organism: Principal | ICD-10-CM

## 2020-07-28 NOTE — Plan of Care (Signed)

## 2020-07-28 NOTE — Evaluation (Signed)
Physical Therapy Evaluation Patient Details Name: Duane Guzman MRN: [id number] DOB: 1941-08-07 Today's Date: 07/28/2020   History of Present Illness  Pt is a 79 y/o M admitted on 07/26/20 with worsening weakness over last several days & inability to walk x 2 days. Pt found to be septic 2/2 pneumonia & now being treated with IV antibiotics. PMH: multiple CVAs, vascular dementia, HTN, HLD, depression/anxiety, GERD, hiatal hernia    Clinical Impression  Pt pleasant & agreeable to tx. Pt requires significant assistance for bed mobility as noted below. Pt able tolerate sitting EOB ~5 minutes but with c/o dizziness that decrease but do not completely subside. BP = 150/104 mmHg (LUE, sitting, pt's BP running high per chart) - nurse made aware. Pt engages in reaching forward with LUE to therapist's hand to address sitting balance with pt requiring HOH assist and demonstrates decreased anterior weight shifting. Due to pt's dizziness not completely going away pt assisted back to bed and does participate in scooting to [facility] with bed in trendelenburg position and max/total assist. PTA pt was ambulatory with HHA (per chart) and would currently benefit from acute PT services to address bed mobility, sitting balance, transfers & gait as able. Recommending SNF level of care upon d/c.    Follow Up Recommendations SNF;Supervision/Assistance - 24 hour    Equipment Recommendations   (TBD in next venue)    Recommendations for Other Services       Precautions / Restrictions Precautions Precautions: Fall Precaution Comments: old R hemi Restrictions Weight Bearing Restrictions: No      Mobility  Bed Mobility Overal bed mobility: Needs Assistance Bed Mobility: Rolling;Supine to Sit;Sit to Supine Rolling: Mod assist (roll L with bed rails)   Supine to sit: Max assist;HOB elevated Sit to supine: Max assist;HOB elevated   General bed mobility comments: assist with B LEs and trunk support     Transfers Overall transfer level:  (deferred 2/2 pt c/o dizziness)              Ambulation/Gait                Stairs            Wheelchair Mobility    Modified Rankin (Stroke Patients Only)       Balance Overall balance assessment: Needs assistance Sitting-balance support: Feet supported Sitting balance-Leahy Scale: Fair Sitting balance - Comments: initially needing min A progressing to close supervision Postural control: Posterior lean                              Pertinent Vitals/Pain Pain Assessment: Faces Faces Pain Scale: No hurt    Home Living Family/patient expects to be discharged to:: Skilled nursing facility Living Arrangements: Spouse/significant other Available Help at Discharge: Family;Available 24 hours/day Type of Home: House       Home Layout: One level   Additional Comments: no family present to provide home set up information, information taken from chart    Prior Function Level of [place]: Needs assistance   Gait / Transfers Assistance Needed: partner provides min HHA at home for house hold distances  ADL's / Homemaking Assistance Needed: Husband assists with ADLs as well as an aide come 2x/wk        Hand Dominance   Dominant Hand: Left    Extremity/Trunk Assessment   Upper Extremity Assessment Upper Extremity Assessment:  (pt maintains RUE elbow, wrist & digit flexion throughout session) RUE Deficits /  Details: increased tone. Partner reports he refuses to wear resting hand splint at home. PROM ~ [facility]    Lower Extremity Assessment Lower Extremity Assessment: Generalized weakness;Difficult to assess due to impaired cognition (difficult to assess AROM 2/2 cognitive deficits, pt with tight knee/ankle joints but PROM BLE WLF (also unsure if pt was resisting PROM movement))    Cervical / Trunk Assessment Cervical / Trunk Assessment: Kyphotic  Communication   Communication: Expressive  difficulties;HOH  Cognition Arousal/Alertness: Awake/alert Behavior During Therapy: WFL for tasks assessed/performed Overall Cognitive Status: No family/caregiver present to determine baseline cognitive functioning                                 General Comments: no caregiver present during session, per OT who contacted pt's partner, pt's current presentation is baseline. pt with difficulty answering questions when given choice of 2.      General Comments      Exercises     Assessment/Plan    PT Assessment Patient needs continued PT services  PT Problem List Decreased strength;Decreased mobility;Decreased safety awareness;Decreased knowledge of precautions;Decreased coordination;Decreased range of motion;Decreased activity tolerance;Cardiopulmonary status limiting activity;Decreased cognition;Decreased knowledge of use of DME;Decreased balance       PT Treatment Interventions DME instruction;Therapeutic exercise;Wheelchair mobility training;Manual techniques;Balance training;Gait training;Neuromuscular re-education;Modalities;Stair training;Functional mobility training;Cognitive remediation;Therapeutic activities;Patient/family education    PT Goals (Current goals can be found in the Care Plan section)  Acute Rehab PT Goals Patient Stated Goal: none stated PT Goal Formulation: Patient unable to participate in goal setting Time For Goal Achievement: 08/10/20 Potential to Achieve Goals: Fair    Frequency Min 2X/week   Barriers to discharge Decreased caregiver support      Co-evaluation               AM-PAC PT "6 Clicks" Mobility  Outcome Measure Help needed turning from your back to your side while in a flat bed without using bedrails?: A Lot Help needed moving from lying on your back to sitting on the side of a flat bed without using bedrails?: A Lot Help needed moving to and from a bed to a chair (including a wheelchair)?: Total Help needed standing up  from a chair using your arms (e.g., wheelchair or bedside chair)?: Total Help needed to walk in hospital room?: Total Help needed climbing 3-5 steps with a railing? : Total 6 Click Score: 8    End of Session   Activity Tolerance: Patient tolerated treatment well Patient left: in bed;with bed alarm set;with call bell/phone within reach Nurse Communication:  (c/o dizziness during session) PT Visit Diagnosis: Other abnormalities of gait and mobility (R26.89);Muscle weakness (generalized) (M62.81);Difficulty in walking, not elsewhere classified (R26.2)    Time: 1660-6301 PT Time Calculation (min) (ACUTE ONLY): 26 min   Charges:     PT Treatments $Therapeutic Activity: 8-22 mins        Aleda Grana, PT, DPT 07/28/20, 3:45 PM   Sandi Mariscal 07/28/2020, 3:42 PM

## 2020-07-28 NOTE — Plan of Care (Signed)
  Problem: Education: Goal: Knowledge of General Education information will improve Description: Including pain rating scale, medication(s)/side effects and non-pharmacologic comfort measures 07/28/2020 0847 by Ansel Bong, RN Outcome: Progressing 07/28/2020 0847 by Ansel Bong, RN Outcome: Progressing   Problem: Health Behavior/Discharge Planning: Goal: Ability to manage health-related needs will improve 07/28/2020 0847 by Ansel Bong, RN Outcome: Progressing 07/28/2020 0847 by Ansel Bong, RN Outcome: Progressing   Problem: Clinical Measurements: Goal: Ability to maintain clinical measurements within normal limits will improve 07/28/2020 0847 by Ansel Bong, RN Outcome: Progressing 07/28/2020 0847 by Ansel Bong, RN Outcome: Progressing Goal: Will remain free from infection 07/28/2020 0847 by Ansel Bong, RN Outcome: Progressing 07/28/2020 0847 by Ansel Bong, RN Outcome: Progressing Goal: Diagnostic test results will improve 07/28/2020 0847 by Ansel Bong, RN Outcome: Progressing 07/28/2020 0847 by Ansel Bong, RN Outcome: Progressing Goal: Respiratory complications will improve 07/28/2020 0847 by Ansel Bong, RN Outcome: Progressing 07/28/2020 0847 by Ansel Bong, RN Outcome: Progressing Goal: Cardiovascular complication will be avoided 07/28/2020 0847 by Ansel Bong, RN Outcome: Progressing 07/28/2020 0847 by Ansel Bong, RN Outcome: Progressing   Problem: Activity: Goal: Risk for activity intolerance will decrease 07/28/2020 0847 by Ansel Bong, RN Outcome: Progressing 07/28/2020 0847 by Ansel Bong, RN Outcome: Progressing   Problem: Nutrition: Goal: Adequate nutrition will be maintained 07/28/2020 0847 by Ansel Bong, RN Outcome: Progressing 07/28/2020 0847 by Ansel Bong, RN Outcome: Progressing   Problem: Coping: Goal: Level of anxiety will decrease 07/28/2020 0847 by Ansel Bong, RN Outcome:  Progressing 07/28/2020 0847 by Ansel Bong, RN Outcome: Progressing   Problem: Elimination: Goal: Will not experience complications related to bowel motility 07/28/2020 0847 by Ansel Bong, RN Outcome: Progressing 07/28/2020 0847 by Ansel Bong, RN Outcome: Progressing Goal: Will not experience complications related to urinary retention 07/28/2020 0847 by Ansel Bong, RN Outcome: Progressing 07/28/2020 0847 by Ansel Bong, RN Outcome: Progressing   Problem: Pain Managment: Goal: General experience of comfort will improve 07/28/2020 0847 by Ansel Bong, RN Outcome: Progressing 07/28/2020 0847 by Ansel Bong, RN Outcome: Progressing   Problem: Safety: Goal: Ability to remain free from injury will improve 07/28/2020 0847 by Ansel Bong, RN Outcome: Progressing 07/28/2020 0847 by Ansel Bong, RN Outcome: Progressing   Problem: Skin Integrity: Goal: Risk for impaired skin integrity will decrease 07/28/2020 0847 by Ansel Bong, RN Outcome: Progressing 07/28/2020 0847 by Ansel Bong, RN Outcome: Progressing   Problem: Fluid Volume: Goal: Hemodynamic stability will improve 07/28/2020 0847 by Ansel Bong, RN Outcome: Progressing 07/28/2020 0847 by Ansel Bong, RN Outcome: Progressing   Problem: Clinical Measurements: Goal: Diagnostic test results will improve 07/28/2020 0847 by Ansel Bong, RN Outcome: Progressing 07/28/2020 0847 by Ansel Bong, RN Outcome: Progressing Goal: Signs and symptoms of infection will decrease 07/28/2020 0847 by Ansel Bong, RN Outcome: Progressing 07/28/2020 0847 by Ansel Bong, RN Outcome: Progressing   Problem: Respiratory: Goal: Ability to maintain adequate ventilation will improve 07/28/2020 0847 by Ansel Bong, RN Outcome: Progressing 07/28/2020 0847 by Ansel Bong, RN Outcome: Progressing

## 2020-07-28 NOTE — Progress Notes (Addendum)
Progress Note    Duane Guzman  JWJ:[id number] DOB: 1940-11-25  DOA: 07/26/2020 PCP: Patient, No Pcp Per      Brief Narrative:    Medical records reviewed and are as summarized below:  Duane Guzman is a 79 y.o. male       Assessment/Plan:   Principal Problem:   Sepsis (HCC) Active Problems:   CAP (community acquired pneumonia)   CVA (cerebral vascular accident) (HCC)   Dementia (HCC)   Encephalopathy acute   Body mass index is 24.37 kg/m.   Sepsis secondary to pneumonia: Continue IV Rocephin and azithromycin. 1 out of 4 blood culture bottles showed staph epidermidis. This is likely a contaminant. Repeat blood culture was sent today.   Acute metabolic encephalopathy with underlying dementia: Continue supportive care. Continue psychotropics.   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    Diet Order            DIET DYS 3 Room service appropriate? Yes with Assist; Fluid consistency: Thin  Diet effective now                    Consultants:  None  Procedures:  None    Medications:   . ARIPiprazole  2.5 mg Oral Q1200  . atorvastatin  10 mg Oral QHS  . citalopram  20 mg Oral Daily  . gabapentin  300 mg Oral QPM  . iohexol  500 mL Oral Once  . levETIRAcetam  1,000 mg Oral QHS  . levETIRAcetam  500 mg Oral q morning - 10a  . mirtazapine  3.75 mg Oral Daily  . pantoprazole  40 mg Oral Q0600  . rivaroxaban  20 mg Oral QPM  . [START ON 07/30/2020] Vitamin D (Ergocalciferol)  50,000 Units Oral Q Mon   Continuous Infusions: . azithromycin 500 mg (07/28/20 1119)  . cefTRIAXone (ROCEPHIN)  IV 2 g (07/28/20 1026)     Anti-infectives (From  admission, onward)   Start     Dose/Rate Route Frequency Ordered Stop   07/27/20 1100  cefTRIAXone (ROCEPHIN) 2 g in sodium chloride 0.9 % 100 mL IVPB  Status:  Discontinued        2 g 200 mL/hr over 30 Minutes Intravenous Every 24 hours 07/26/20 1404 07/26/20 1408   07/27/20 1100  azithromycin (ZITHROMAX) 500 mg in sodium chloride 0.9 % 250 mL IVPB  Status:  Discontinued        500 mg 250 mL/hr over 60 Minutes Intravenous Every 24 hours 07/26/20 1404 07/26/20 1408   07/26/20 1030  cefTRIAXone (ROCEPHIN) 2 g in sodium chloride 0.9 % 100 mL IVPB        2 g 200 mL/hr over 30 Minutes Intravenous Every 24 hours 07/26/20 1018     07/26/20 1030  azithromycin (ZITHROMAX) 500 mg in sodium chloride 0.9 % 250 mL IVPB        500 mg 250 mL/hr over 60 Minutes Intravenous Every 24 hours 07/26/20 1018               Family Communication/Anticipated D/C date and plan/Code Status   DVT prophylaxis:  rivaroxaban (XARELTO) tablet 20 mg     Code Status: DNR  Family Communication:  Disposition Plan:    Status is: Inpatient  Remains  inpatient appropriate because:IV treatments appropriate due to intensity of illness or inability to take PO and Inpatient level of care appropriate due to severity of illness   Dispo: The patient is from: Home              Anticipated d/c is to: Home              Anticipated d/c date is: 3 days              Patient currently is not medically stable to d/c.           Subjective:   Interval events noted. He is still confused and does not provide any meaningful history.  Objective:    Vitals:   07/27/20 1928 07/28/20 0340 07/28/20 0400 07/28/20 0800  BP: 124/81 128/79  (!) 141/91  Pulse: 88 90  95  Resp: 14 18  17   Temp: (!) 97.5 F (36.4 C) 98.1 F (36.7 C)  98.8 F (37.1 C)  TempSrc: Oral Oral  Oral  SpO2: 96% 95%  96%  Weight:   68.5 kg   Height:       No data found.   Intake/Output Summary (Last 24 hours) at 07/28/2020 1133 Last  data filed at 07/28/2020 1110 Gross per 24 hour  Intake 368.95 ml  Output 1450 ml  Net -1081.05 ml   Filed Weights   07/26/20 0930 07/27/20 0427 07/28/20 0400  Weight: 65.8 kg 67.2 kg 68.5 kg    Exam:   GEN: NAD SKIN: Warm and dry EYES: EOMI ENT: MMM CV: RRR PULM: CTA B ABD: soft, ND, NT, +BS CNS: AAO x 1(person), right upper extremity monoplegia with contractures EXT: No edema or tenderness     Data Reviewed:   I have personally reviewed following labs and imaging studies:  Labs: Labs show the following:   Basic Metabolic Panel: Recent Labs  Lab 07/26/20 0935  NA 135  K 3.7  CL 102  CO2 26  GLUCOSE 102*  BUN 13  CREATININE 0.75  CALCIUM 8.6*   GFR Estimated Creatinine Clearance: 66.5 mL/min (by C-G formula based on SCr of 0.75 mg/dL). Liver Function Tests: Recent Labs  Lab 07/26/20 1006  AST 30  ALT 15  ALKPHOS 79  BILITOT 1.0  PROT 6.3*  ALBUMIN 3.5   No results for input(s): LIPASE, AMYLASE in the last 168 hours. No results for input(s): AMMONIA in the last 168 hours. Coagulation profile Recent Labs  Lab 07/26/20 1006  INR 1.9*    CBC: Recent Labs  Lab 07/26/20 0935  WBC 7.1  HGB 14.0  HCT 41.6  MCV 97.7  PLT 142*   Cardiac Enzymes: No results for input(s): CKTOTAL, CKMB, CKMBINDEX, TROPONINI in the last 168 hours. BNP (last 3 results) No results for input(s): PROBNP in the last 8760 hours. CBG: No results for input(s): GLUCAP in the last 168 hours. D-Dimer: No results for input(s): DDIMER in the last 72 hours. Hgb A1c: No results for input(s): HGBA1C in the last 72 hours. Lipid Profile: No results for input(s): CHOL, HDL, LDLCALC, TRIG, CHOLHDL, LDLDIRECT in the last 72 hours. Thyroid function studies: No results for input(s): TSH, T4TOTAL, T3FREE, THYROIDAB in the last 72 hours.  Invalid input(s): FREET3 Anemia work up: No results for input(s): VITAMINB12, FOLATE, FERRITIN, TIBC, IRON, RETICCTPCT in the last 72  hours. Sepsis Labs: Recent Labs  Lab 07/26/20 0935 07/26/20 1006  WBC 7.1  --   LATICACIDVEN  --  1.0  Microbiology Recent Results (from the past 240 hour(s))  Urine culture     Status: None   Collection Time: 07/26/20  9:35 AM   Specimen: In/Out Cath Urine  Result Value Ref Range Status   Specimen Description   Final    IN/OUT CATH URINE Performed at [facility], [address], [place], [place] [address]    Special Requests   Final    NONE Performed at [facility], [address], [place], [place] [address]    Culture   Final    NO GROWTH Performed at [facility], [address] [place], [place] [address]    Report Status 07/27/2020 FINAL  Final  Blood culture (routine single)     Status: Abnormal (Preliminary result)   Collection Time: 07/26/20 10:06 AM   Specimen: BLOOD  Result Value Ref Range Status   Specimen Description   Final    BLOOD BLOOD RIGHT HAND Performed at [facility], [address], [place], [place] [address]    Special Requests   Final    BOTTLES DRAWN AEROBIC AND ANAEROBIC Blood Culture results may not be optimal due to an inadequate volume of blood received in culture bottles Performed at [facility], [address], [place], [place] [address]    Culture  Setup Time   Final    GRAM POSITIVE COCCI ANAEROBIC BOTTLE ONLY Organism ID to follow CRITICAL RESULT CALLED TO, READ BACK BY AND VERIFIED WITH: SCOTT HALL AT 0631 ON 07/27/2020 [facility]. Performed at [facility], [address] [place], [place] [address]    Culture (A)  Final    STAPHYLOCOCCUS EPIDERMIDIS THE SIGNIFICANCE OF ISOLATING THIS ORGANISM FROM A SINGLE SET OF BLOOD CULTURES WHEN MULTIPLE SETS ARE DRAWN IS UNCERTAIN. PLEASE NOTIFY THE MICROBIOLOGY DEPARTMENT WITHIN ONE WEEK IF SPECIATION AND SENSITIVITIES ARE REQUIRED. Performed at [facility], [address], [place], [place] [address]    Report Status PENDING   Incomplete  Blood culture (routine x 2)     Status: None (Preliminary result)   Collection Time: 07/26/20 10:06 AM   Specimen: BLOOD  Result Value Ref Range Status   Specimen Description BLOOD LEFT ANTECUBITAL  Final   Special Requests   Final    BOTTLES DRAWN AEROBIC AND ANAEROBIC Blood Culture adequate volume   Culture   Final    NO GROWTH 2 DAYS Performed at [facility], [address] [place], [place] [address]    Report Status PENDING  Incomplete  Blood Culture ID Panel (Reflexed)     Status: Abnormal   Collection Time: 07/26/20 10:06 AM  Result Value Ref Range Status   Enterococcus faecalis NOT DETECTED NOT DETECTED Final   Enterococcus Faecium NOT DETECTED NOT DETECTED Final   Listeria monocytogenes NOT DETECTED NOT DETECTED Final   Staphylococcus species DETECTED (A) NOT DETECTED Final    Comment: CRITICAL RESULT CALLED TO, READ BACK BY AND VERIFIED WITH: SCOTT HALL AT 0631 ON 07/27/2020 [facility].    Staphylococcus aureus (BCID) NOT DETECTED NOT DETECTED Final   Staphylococcus epidermidis DETECTED (A) NOT DETECTED Final    Comment: Methicillin (oxacillin) resistant coagulase negative staphylococcus. Possible blood culture contaminant (unless isolated from more than one blood culture draw or clinical case suggests pathogenicity). No antibiotic treatment is indicated for blood  culture contaminants. CRITICAL RESULT CALLED TO, READ BACK BY AND VERIFIED WITH: SCOTT HALL AT 0631 ON 07/27/2020 [facility].    Staphylococcus lugdunensis NOT DETECTED NOT DETECTED  Final   Streptococcus species NOT DETECTED NOT DETECTED Final   Streptococcus agalactiae NOT DETECTED NOT DETECTED Final   Streptococcus pneumoniae NOT DETECTED NOT DETECTED Final   Streptococcus pyogenes NOT DETECTED NOT DETECTED Final   A.calcoaceticus-baumannii NOT DETECTED NOT DETECTED Final   Bacteroides fragilis NOT DETECTED NOT DETECTED Final   Enterobacterales NOT DETECTED NOT DETECTED Final   Enterobacter  cloacae complex NOT DETECTED NOT DETECTED Final   Escherichia coli NOT DETECTED NOT DETECTED Final   Klebsiella aerogenes NOT DETECTED NOT DETECTED Final   Klebsiella oxytoca NOT DETECTED NOT DETECTED Final   Klebsiella pneumoniae NOT DETECTED NOT DETECTED Final   Proteus species NOT DETECTED NOT DETECTED Final   Salmonella species NOT DETECTED NOT DETECTED Final   Serratia marcescens NOT DETECTED NOT DETECTED Final   Haemophilus influenzae NOT DETECTED NOT DETECTED Final   Neisseria meningitidis NOT DETECTED NOT DETECTED Final   Pseudomonas aeruginosa NOT DETECTED NOT DETECTED Final   Stenotrophomonas maltophilia NOT DETECTED NOT DETECTED Final   Candida albicans NOT DETECTED NOT DETECTED Final   Candida auris NOT DETECTED NOT DETECTED Final   Candida glabrata NOT DETECTED NOT DETECTED Final   Candida krusei NOT DETECTED NOT DETECTED Final   Candida parapsilosis NOT DETECTED NOT DETECTED Final   Candida tropicalis NOT DETECTED NOT DETECTED Final   Cryptococcus neoformans/gattii NOT DETECTED NOT DETECTED Final   Methicillin resistance mecA/C DETECTED (A) NOT DETECTED Final    Comment: CRITICAL RESULT CALLED TO, READ BACK BY AND VERIFIED WITH: SCOTT HALL AT 0631 ON 07/27/2020 [facility]. Performed at [facility], [address] [place], [place] [address]   Respiratory Panel by RT PCR (Flu A&B, Covid) -     Status: None   Collection Time: 07/26/20 12:01 PM   Specimen: Nasopharyngeal  Result Value Ref Range Status   SARS Coronavirus 2 by RT PCR NEGATIVE NEGATIVE Final    Comment: (NOTE) SARS-CoV-2 target nucleic acids are NOT DETECTED.  The SARS-CoV-2 RNA is generally detectable in upper respiratoy specimens during the acute phase of infection. The lowest concentration of SARS-CoV-2 viral copies this assay can detect is 131 copies/mL. A negative result does not preclude SARS-Cov-2 infection and should not be used as the sole basis for treatment or other patient management  decisions. A negative result may occur with  improper specimen collection/handling, submission of specimen other than nasopharyngeal swab, presence of viral mutation(s) within the areas targeted by this assay, and inadequate number of viral copies (<131 copies/mL). A negative result must be combined with clinical observations, patient history, and epidemiological information. The expected result is Negative.  Fact Sheet for Patients:  https://www.moore.com/  Fact Sheet for Healthcare Providers:  https://www.young.biz/  This test is no t yet approved or cleared by the [place] FDA and  has been authorized for detection and/or diagnosis of SARS-CoV-2 by FDA under an Emergency Use Authorization (EUA). This EUA will remain  in effect (meaning this test can be used) for the duration of the COVID-19 declaration under Section 564(b)(1) of the Act, 21 U.S.C. section 360bbb-3(b)(1), unless the authorization is terminated or revoked sooner.     Influenza A by PCR NEGATIVE NEGATIVE Final   Influenza B by PCR NEGATIVE NEGATIVE Final    Comment: (NOTE) The Xpert Xpress SARS-CoV-2/FLU/RSV assay is intended as an aid in  the diagnosis of influenza from Nasopharyngeal swab specimens and  should not be used as a sole basis for treatment. Nasal washings and  aspirates are unacceptable for Xpert Xpress  SARS-CoV-2/FLU/RSV  testing.  Fact Sheet for Patients: https://www.moore.com/  Fact Sheet for Healthcare Providers: https://www.young.biz/  This test is not yet approved or cleared by the [place] FDA and  has been authorized for detection and/or diagnosis of SARS-CoV-2 by  FDA under an Emergency Use Authorization (EUA). This EUA will remain  in effect (meaning this test can be used) for the duration of the  Covid-19 declaration under Section 564(b)(1) of the Act, 21  U.S.C. section 360bbb-3(b)(1), unless the  authorization is  terminated or revoked. Performed at [facility], [address] [place], [place] [address]     Procedures and diagnostic studies:  CT ABDOMEN PELVIS WO CONTRAST  Result Date: 07/26/2020 CLINICAL DATA:  Acute nonlocalized abdominal pain.  Weakness. EXAM: CT ABDOMEN AND PELVIS WITHOUT CONTRAST TECHNIQUE: Multidetector CT imaging of the abdomen and pelvis was performed following the standard protocol without IV contrast. COMPARISON:  None. FINDINGS: Lower chest: Patchy atelectasis and or infiltrate in both lower lungs. Small amount of pleural fluid left more than right. Extensive coronary artery calcification. Hiatal hernia containing fat and portions of the stomach. Hepatobiliary: Normal without contrast. Pancreas: Normal Spleen: Normal Adrenals/Urinary Tract: Adrenal glands are normal. Kidneys are normal except for a simple appearing cyst at the lower pole on the right measuring 2.5 cm in diameter. No hydronephrosis, stone or mass. Small bladder diverticula. Stomach/Bowel: Hiatal hernia as noted above containing portions of the stomach. No sign of obstruction. No small bowel pathology is evident. No acute or significant: Finding. Diverticulosis without CT evidence of diverticulitis. Vascular/Lymphatic: Aortic atherosclerosis. No aneurysm. IVC is normal. No retroperitoneal adenopathy. Reproductive: Normal Other: Small amount of intraperitoneal fluid without loculation. No free air. Musculoskeletal: Chronic curvature in degenerative changes of the spine. IMPRESSION: 1. Patchy atelectasis and or infiltrate in both lower lungs. Small amount of pleural fluid left more than right. 2. Hiatal hernia containing fat and portions of the stomach. No sign of obstruction. 3. Aortic atherosclerosis. Coronary artery calcification. 4. Diverticulosis without CT evidence of diverticulitis. Low level diverticulitis can be inapparent at imaging. 5. Small amount of intraperitoneal fluid without  loculation. Aortic Atherosclerosis (ICD10-I70.0). Electronically Signed   By: Paulina Fusi M.D.   On: 07/26/2020 13:49   CT HEAD WO CONTRAST  Result Date: 07/26/2020 CLINICAL DATA:  Worsening weakness EXAM: CT HEAD WITHOUT CONTRAST TECHNIQUE: Contiguous axial images were obtained from the base of the skull through the vertex without intravenous contrast. COMPARISON:  None. FINDINGS: Brain: There is no acute intracranial hemorrhage, mass effect, or edema. No acute appearing loss of gray-white differentiation. There are multiple chronic infarcts including involvement of the left frontal, parietal, and occipital lobes, right occipital lobe, and left lateral cerebellum. Additional confluent areas hypoattenuation in the supratentorial white nonspecific but probably reflect moderate chronic microvascular ischemic changes. Prominence of the ventricles and sulci reflects parenchymal volume loss. There is no extra-axial fluid collection. Vascular: There is atherosclerotic calcification at the skull base. Skull: Calvarium is unremarkable. Sinuses/Orbits: No acute finding. Other: None. IMPRESSION: No acute intracranial abnormality. Multiple chronic infarcts. As there is no comparison available, subtle superimposed acute infarct difficult to exclude. Electronically Signed   By: Guadlupe Spanish M.D.   On: 07/26/2020 13:49   CT CHEST WO CONTRAST  Result Date: 07/26/2020 CLINICAL DATA:  Pneumonia EXAM: CT CHEST WITHOUT CONTRAST TECHNIQUE: Multidetector CT imaging of the chest was performed following the standard protocol without IV contrast. COMPARISON:  Chest radiograph July 26, 2020. FINDINGS: Cardiovascular: Ascending thoracic aortic diameter measures 4.0  x 4.0 cm. There are scattered foci of calcification in proximal visualized great vessels. Note that the right innominate and left common carotid arteries arise as a common trunk, an anatomic variant. There is aortic atherosclerosis as well as multiple foci of  coronary artery calcification. No appreciable pericardial effusion or pericardial thickening. Mediastinum/Nodes: Thyroid appears unremarkable. There is no appreciable lymph node enlargement in the thoracic region. Several subcentimeter mediastinal lymph nodes are seen which do not meet size criteria for pathologic significance. There is a fairly sizable paraesophageal hernia with much of the stomach above the diaphragm. Lungs/Pleura: There is underlying centrilobular and paraseptal emphysematous change. There are scattered areas of fibrosis in the periphery of the lungs bilaterally. There is patchy atelectatic change associated with scarring and fibrosis in the lung bases. There is no appreciable airspace consolidation. There is mild lower lobe bronchiectatic change. No appreciable pleural effusions. Upper Abdomen: There is upper abdominal aortic atherosclerosis. Visualized upper abdominal structures otherwise appear unremarkable. Musculoskeletal: There is degenerative change in the thoracic spine. There are no blastic or lytic bone lesions. Apparent loop recorder on the left anteriorly. IMPRESSION: 1. Extensive centrilobular and paraseptal emphysematous changes. Scattered areas of fibrosis and atelectasis. No frank consolidation. Mild lower lobe bronchiectatic change. It should be noted that subtle infiltrate in the areas of scarring and atelectasis in the lung bases is difficult to exclude with certainty. 2.  No evident adenopathy. 3. Sizable paraesophageal type hernia with much of the stomach above the diaphragm. 4. Ascending thoracic aortic diameter measures 4.0 x 4.0 cm. Recommend annual imaging followup by CTA or MRA. This recommendation follows 2010 ACCF/AHA/AATS/ACR/ASA/SCA/SCAI/SIR/STS/SVM Guidelines for the Diagnosis and Management of Patients with Thoracic Aortic Disease. Circulation. 2010; 121: G665-L935. Aortic aneurysm NOS (ICD10-I71.9). Foci of aortic atherosclerosis as well as foci of great vessel  and coronary artery calcification noted. Aortic Atherosclerosis (ICD10-I70.0) and Emphysema (ICD10-J43.9). Electronically Signed   By: Bretta Bang III M.D.   On: 07/26/2020 12:41               LOS: 2 days   Annelise Mccoy  [facility]   Pager on [email or web address]. If 7PM-7AM, please contact night-coverage at [email or web address]     07/28/2020, 11:33 AM

## 2020-07-28 NOTE — TOC Initial Note (Signed)
Transition of Care [facility]) - Initial/Assessment Note    Patient Details  Name: Duane Guzman MRN: [id number] Date of Birth: [birth year]  Transition [facility]) CM/SW Contact:    Eilleen Kempf, LCSW Phone Number: 07/28/2020, 2:53 PM  Clinical Narrative:                 Patient recently moved here from [place], [place]. Lives with his spouse, Reuel Boom who also has POA. CSW discussed discharge plan with Reuel Boom, he is requesting CSW send out a SNF search and then call when there is an offer made. CSW confirmed a work-up will be completed and a bed search sent out.        Patient Goals and CMS Choice        Expected Discharge Plan and Services                                                Prior Living Arrangements/Services                       Activities of Daily Living   ADL Screening (condition at time of admission) Patient's cognitive ability adequate to safely complete daily activities?: No Is the patient deaf or have difficulty hearing?: No Does the patient have difficulty seeing, even when wearing glasses/contacts?: No Does the patient have difficulty concentrating, remembering, or making decisions?: Yes Patient able to express need for assistance with ADLs?: No Does the patient have difficulty dressing or bathing?: Yes Independently performs ADLs?: No Communication: Needs assistance Is this a change from baseline?: Pre-admission baseline Dressing (OT): Dependent Is this a change from baseline?: Pre-admission baseline Feeding: Dependent Is this a change from baseline?: Pre-admission baseline Bathing: Dependent Is this a change from baseline?: Pre-admission baseline Toileting: Dependent Is this a change from baseline?: Pre-admission baseline In/Out Bed: Dependent Is this a change from baseline?: Pre-admission baseline Walks in Home: Dependent Is this a change from baseline?: Pre-admission baseline Does the patient have difficulty walking or  climbing stairs?: Yes Weakness of Legs: Both Weakness of Arms/Hands: Both  Permission Sought/Granted                  Emotional Assessment              Admission diagnosis:  CAP (community acquired pneumonia) [J18.9] Acute metabolic encephalopathy [G93.41] Community acquired pneumonia of left lung, unspecified part of lung [J18.9] Patient Active Problem List   Diagnosis Date Noted  . CAP (community acquired pneumonia) 07/26/2020  . Sepsis (HCC) 07/26/2020  . CVA (cerebral vascular accident) (HCC) 07/26/2020  . Dementia (HCC) 07/26/2020  . Encephalopathy acute 07/26/2020   PCP:  Patient, No Pcp Per Pharmacy:   [id number] [facility], [place] - [address] [place] [address] Phone: [phone] Fax: [phone]     Social Determinants of Health (SDOH) Interventions    Readmission Risk Interventions No flowsheet data found.

## 2020-07-28 NOTE — Evaluation (Signed)
Occupational Therapy Evaluation Patient Details Name: Duane Guzman MRN: [id number] DOB: [birth year] Today's Date: 07/28/2020    History of Present Illness 79 y.o. male with medical history significant for multiple CVA and vascular dementia who was brought into the ER by EMS for evaluation of weakness and difficulty with ambulation for about 2 days which his spouse said is atypical for him.  Over the last 24 hours prior to his admission the patient has been very weak and unable to ambulate which is far from his baseline of being able to ambulate with minimum assistance.  His spouse also states that he appears to be more confused than his baseline and on the morning of his presentation was unable to take his medication like he usually will and had water spilling out of his mouth.  He was concerned he may have had a TIA and so brought him to the emergency room for evaluation   Clinical Impression   Patient presenting with decreased I in self care, balance, functional mobility/transfers, endurance, and safety awareness. Patient's spouse reports he assisted pt with self care tasks and provided min HHA for ambulation PTA. Pt only walking household distances at home. Patient currently functioning at mod - max A for self care tasks with posterior bias noted in sitting and standing. Patient will benefit from acute OT to increase overall independence in the areas of ADLs, functional mobility, and safety awareness in order to safely discharge to next venue of care.    Follow Up Recommendations  SNF    Equipment Recommendations  Other (comment) (defer to next venue of care)       Precautions / Restrictions Precautions Precautions: Fall Restrictions Weight Bearing Restrictions: No      Mobility Bed Mobility Overal bed mobility: Needs Assistance Bed Mobility: Rolling;Supine to Sit;Sit to Supine Rolling: Mod assist   Supine to sit: Max assist Sit to supine: Max assist   General bed mobility  comments: assist with B LEs and trunk support    Transfers Overall transfer level: Needs assistance Equipment used: 1 person hand held assist Transfers: Sit to/from [employer] Transfers Sit to Stand: Mod assist Stand pivot transfers: Mod assist            Balance Overall balance assessment: Needs assistance Sitting-balance support: Feet supported Sitting balance-Leahy Scale: Fair Sitting balance - Comments: initially needing min A progressing to close supervision Postural control: Posterior lean Standing balance support: During functional activity;Single extremity supported Standing balance-Leahy Scale: Poor Standing balance comment: posterior bias in standing          ADL either performed or assessed with clinical judgement   ADL Overall ADL's : Needs assistance/impaired Eating/Feeding: Set up;Minimal assistance;Sitting   Grooming: Wash/dry hands;Wash/dry face;Oral care;Set up;Sitting     Toilet Transfer: BSC;Moderate assistance   General ADL Comments: Pt needing mod A for functional transfer with heavy posterior bias and HHA. Pt needing set up A to open containers this session and min cuing with increased time to sequence tasks     Vision Patient Visual Report: No change from baseline              Pertinent Vitals/Pain Pain Assessment: Faces Faces Pain Scale: No hurt     Hand Dominance Left   Extremity/Trunk Assessment Upper Extremity Assessment Upper Extremity Assessment: RUE deficits/detail RUE Deficits / Details: increased tone. Partner reports he refuses to wear resting hand splint at home. PROM ~ [facility]       Cervical / Trunk Assessment Cervical /  Trunk Assessment: Kyphotic   Communication Communication Communication: Expressive difficulties   Cognition Arousal/Alertness: Awake/alert Behavior During Therapy: WFL for tasks assessed/performed Overall Cognitive Status: History of cognitive impairments - at baseline    General Comments: No  caregiver present during session. OT calling partner on phone to obtain pt history and baseline. Per caregiver, pt's current presentation is baseline. Pt able to answer questions with choice of 2 but more difficulty with open ended questions secondary to deficits from prior CVA              Home Living Family/patient expects to be discharged to:: Skilled nursing facility Living Arrangements: Spouse/significant other Available Help at Discharge: Family;Available 24 hours/day Type of Home: House       Home Layout: One level           Prior Functioning/Environment Level of Independence: Needs assistance  Gait / Transfers Assistance Needed: partner provides min HHA at home for house hold distances ADL's / Homemaking Assistance Needed: Husband assists with ADLs as well as an aide come 2x/wk            OT Problem List: Decreased strength;Decreased safety awareness;Decreased activity tolerance;Decreased knowledge of use of DME or AE;Impaired balance (sitting and/or standing);Decreased knowledge of precautions;Decreased cognition      OT Treatment/Interventions: Therapeutic exercise;Therapeutic activities;Energy conservation;Patient/family education;DME and/or AE instruction;Balance training    OT Goals(Current goals can be found in the care plan section) Acute Rehab OT Goals Patient Stated Goal: none stated Time For Goal Achievement: 08/11/20 Potential to Achieve Goals: Fair ADL Goals Pt Will Perform Grooming: with min guard assist;standing Pt Will Transfer to Toilet: with min assist;ambulating Pt Will Perform Toileting - Clothing Manipulation and hygiene: with min assist;sit to/from stand  OT Frequency: Min 1X/week   Barriers to D/C: Decreased caregiver support  partner with back problems and unable to assist at this level          AM-PAC OT "6 Clicks" Daily Activity     Outcome Measure Help from another person eating meals?: A Little Help from another person taking  care of personal grooming?: A Little Help from another person toileting, which includes using toliet, bedpan, or urinal?: Total Help from another person bathing (including washing, rinsing, drying)?: A Lot Help from another person to put on and taking off regular upper body clothing?: A Lot Help from another person to put on and taking off regular lower body clothing?: A Lot 6 Click Score: 13   End of Session Nurse Communication: Mobility status  Activity Tolerance: Patient tolerated treatment well Patient left: in bed;with call bell/phone within reach;with bed alarm set  OT Visit Diagnosis: Unsteadiness on feet (R26.81);Muscle weakness (generalized) (M62.81)                Time: 0930-1009 OT Time Calculation (min): 39 min Charges:  OT General Charges $OT Visit: 1 Visit OT Evaluation $OT Eval Moderate Complexity: 1 Mod OT Treatments $Self Care/Home Management : 8-22 mins $Therapeutic Activity: 8-22 mins  Jackquline Denmark, MS, OTR/L , CBIS ascom [phone]  07/28/20, 11:56 AM

## 2020-07-29 LAB — CULTURE, BLOOD (SINGLE)

## 2020-07-29 MED ORDER — SODIUM CHLORIDE 0.9 % IV SOLN
INTRAVENOUS | Status: DC | PRN
  Administered 2020-07-29: 500 mL via INTRAVENOUS

## 2020-07-29 MED ORDER — AMOXICILLIN-POT CLAVULANATE 875-125 MG PO TABS
1.0000 | ORAL_TABLET | Freq: Two times a day (BID) | ORAL | Status: DC
  Administered 2020-07-29 – 2020-07-30 (×3): 1 via ORAL
  Filled 2020-07-29 (×3): qty 1

## 2020-07-29 NOTE — [place] FL2 (Signed)
[place] MEDICAID FL2 LEVEL OF CARE SCREENING TOOL     IDENTIFICATION  Patient Name: Duane Guzman Birthdate: March 18, 1941 Sex: male Admission Date (Current Location): 07/26/2020  [place] and [place] Number:  Chiropodist and Address:  [facility], [address], [place], [place] [address]      Provider Number: [id number]  Attending Physician Name and Address:  Lurene Shadow, MD  Relative Name and Phone Number:  Mickel Duhamel (husband) - [phone]    Current Level of Care: Hospital Recommended Level of Care: Skilled Nursing Facility Prior Approval Number:    Date Approved/Denied:   PASRR Number: pending approval  Discharge Plan: SNF    Current Diagnoses: Patient Active Problem List   Diagnosis Date Noted  . CAP (community acquired pneumonia) 07/26/2020  . Sepsis (HCC) 07/26/2020  . CVA (cerebral vascular accident) (HCC) 07/26/2020  . Dementia (HCC) 07/26/2020  . Encephalopathy acute 07/26/2020    Orientation RESPIRATION BLADDER Height & Weight     Self  Normal Continent Weight: 142 lb 6.7 oz (64.6 kg) Height:  5\' 6"  (167.6 cm)  BEHAVIORAL SYMPTOMS/MOOD NEUROLOGICAL BOWEL NUTRITION STATUS    Convulsions/Seizures Continent Diet (Fluid)  AMBULATORY STATUS COMMUNICATION OF NEEDS Skin   Limited Assist Verbally Normal                       Personal Care Assistance Level of Assistance  Bathing, Dressing, Feeding Bathing Assistance: Limited assistance Feeding assistance: Limited assistance Dressing Assistance: Limited assistance     Functional Limitations Info             SPECIAL CARE FACTORS FREQUENCY                       Contractures Contractures Info: Present    Additional Factors Info  Code Status Code Status Info: DNR             Current Medications (07/29/2020):  This is the current hospital active medication list Current Facility-Administered Medications  Medication Dose Route  Frequency Provider Last Rate Last Admin  . 0.9 %  sodium chloride infusion   Intravenous PRN 07/31/2020, MD 10 mL/hr at 07/29/20 0901 500 mL at 07/29/20 0901  . acetaminophen (TYLENOL) tablet 650 mg  650 mg Oral Q6H PRN Agbata, Tochukwu, MD       Or  . acetaminophen (TYLENOL) suppository 650 mg  650 mg Rectal Q6H PRN Agbata, Tochukwu, MD      . amoxicillin-clavulanate (AUGMENTIN) 875-125 MG per tablet 1 tablet  1 tablet Oral Q12H 07/31/20, MD      . ARIPiprazole (ABILIFY) tablet 2.5 mg  2.5 mg Oral Q1200 Lurene Shadow, MD   2.5 mg at 07/28/20 1118  . atorvastatin (LIPITOR) tablet 10 mg  10 mg Oral QHS 07/30/20, MD   10 mg at 07/28/20 2114  . citalopram (CELEXA) tablet 20 mg  20 mg Oral Daily 2115, MD   20 mg at 07/29/20 0850  . gabapentin (NEURONTIN) capsule 300 mg  300 mg Oral QPM 07/31/20, MD   300 mg at 07/28/20 1720  . iohexol (OMNIPAQUE) 9 MG/ML oral solution 500 mL  500 mL Oral Once Agbata, Tochukwu, MD      . levETIRAcetam (KEPPRA) tablet 1,000 mg  1,000 mg Oral QHS 07/30/20, MD   1,000 mg at 07/28/20 2114  . levETIRAcetam (KEPPRA) tablet 500 mg  500 mg Oral q morning - 10a 2115, MD  500 mg at 07/29/20 0851  . mirtazapine (REMERON) tablet 3.75 mg  3.75 mg Oral Daily Lurene Shadow, MD   3.75 mg at 07/29/20 0850  . ondansetron (ZOFRAN) tablet 4 mg  4 mg Oral Q6H PRN Agbata, Tochukwu, MD       Or  . ondansetron (ZOFRAN) injection 4 mg  4 mg Intravenous Q6H PRN Agbata, Tochukwu, MD      . pantoprazole (PROTONIX) EC tablet 40 mg  40 mg Oral Q0600 Lurene Shadow, MD   40 mg at 07/29/20 0514  . rivaroxaban (XARELTO) tablet 20 mg  20 mg Oral QPM Lurene Shadow, MD   20 mg at 07/28/20 1720  . [START ON 07/30/2020] Vitamin D (Ergocalciferol) (DRISDOL) capsule 50,000 Units  50,000 Units Oral Q Janey Greaser, MD         Discharge Medications: Please see discharge summary for a list of discharge medications.  Relevant Imaging  Results:  Relevant Lab Results:   Additional Information Trying to obtain SSN# from patient  Delphia Grates, LCSW

## 2020-07-29 NOTE — TOC Progression Note (Signed)
Transition of Care [facility]) - Progression Note    Patient Details  Name: Duane Guzman MRN: [id number] Date of Birth: 24-Jul-1941  [facility]) CM/SW Contact  Delphia Grates, [place] Phone Number: 07/29/2020, 11:57 AM  Clinical Narrative:      CSW tried to complete PSARR for patient but did not have updated SSN#. CSW called patient's husband to see what his SSN# is. Patient's husband did not know. He stated that the patient should know it but will try to tell in spanish and to redirect to tell in english. CSW attempted to call patient via his room number 3x times, unsuccessful.  CSW completed FL2 and awaiting signature from doctor.  CSW sent bed searches out for patient.       Expected Discharge Plan and Services                                                 Social Determinants of Health (SDOH) Interventions    Readmission Risk Interventions No flowsheet data found.

## 2020-07-29 NOTE — Progress Notes (Signed)
Progress Note    Duane Guzman  OYD:[id number] DOB: 07-06-1941  DOA: 07/26/2020 PCP: Patient, No Pcp Per      Brief Narrative:    Medical records reviewed and are as summarized below:  Duane Guzman is a 79 y.o. male       Assessment/Plan:   Principal Problem:   Sepsis (HCC) Active Problems:   CAP (community acquired pneumonia)   CVA (cerebral vascular accident) (HCC)   Dementia (HCC)   Encephalopathy acute   Body mass index is 22.99 kg/m.   Sepsis secondary to pneumonia: Discontinue IV Rocephin and azithromycin.  Start Augmentin.  1 out of 4 blood culture bottles showed staph epidermidis.  This is likely a contaminant.  Repeat blood culture sent on 07/28/2020 has not shown any growth thus far.   Acute metabolic encephalopathy with underlying dementia: Continue supportive care and psychotropics.    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  Plan of care was discussed with his spouse Mr. Steward Ros.    Diet Order            DIET DYS 3 Room service appropriate? Yes with Assist; Fluid consistency: Thin  Diet effective now                    Consultants:  None  Procedures:  None    Medications:   . amoxicillin-clavulanate  1 tablet Oral Q12H  . ARIPiprazole  2.5 mg Oral Q1200  . atorvastatin  10 mg Oral QHS  . citalopram  20 mg Oral Daily  . gabapentin  300 mg Oral QPM  . iohexol  500 mL Oral Once  . levETIRAcetam  1,000 mg Oral QHS  . levETIRAcetam  500 mg Oral q morning - 10a  . mirtazapine  3.75 mg Oral Daily  . pantoprazole  40 mg Oral Q0600  . rivaroxaban  20 mg Oral QPM  . [START ON 07/30/2020] Vitamin D (Ergocalciferol)   50,000 Units Oral Q Mon   Continuous Infusions: . sodium chloride 500 mL (07/29/20 0901)     Anti-infectives (From admission, onward)   Start     Dose/Rate Route Frequency Ordered Stop   07/29/20 1100  amoxicillin-clavulanate (AUGMENTIN) 875-125 MG per tablet 1 tablet        1 tablet Oral Every 12 hours 07/29/20 1004 08/01/20 0959   07/27/20 1100  cefTRIAXone (ROCEPHIN) 2 g in sodium chloride 0.9 % 100 mL IVPB  Status:  Discontinued        2 g 200 mL/hr over 30 Minutes Intravenous Every 24 hours 07/26/20 1404 07/26/20 1408   07/27/20 1100  azithromycin (ZITHROMAX) 500 mg in sodium chloride 0.9 % 250 mL IVPB  Status:  Discontinued        500 mg 250 mL/hr over 60 Minutes Intravenous Every 24 hours 07/26/20 1404 07/26/20 1408   07/26/20 1030  cefTRIAXone (ROCEPHIN) 2 g in sodium chloride 0.9 % 100 mL IVPB  Status:  Discontinued        2 g 200 mL/hr over 30 Minutes Intravenous Every 24 hours 07/26/20 1018 07/29/20 1004   07/26/20 1030  azithromycin (ZITHROMAX) 500 mg in sodium chloride 0.9 % 250 mL IVPB  Status:  Discontinued        500 mg  250 mL/hr over 60 Minutes Intravenous Every 24 hours 07/26/20 1018 07/29/20 1004             Family Communication/Anticipated D/C date and plan/Code Status   DVT prophylaxis:  rivaroxaban (XARELTO) tablet 20 mg     Code Status: DNR  Family Communication:  Disposition Plan:    Status is: Inpatient  Remains inpatient appropriate because:IV treatments appropriate due to intensity of illness or inability to take PO and Inpatient level of care appropriate due to severity of illness   Dispo: The patient is from: Home              Anticipated d/c is to: SNF              Anticipated d/c date is: 2 days              Patient currently is not medically stable to d/c.           Subjective:   Interval events noted.  No shortness of breath or chest pain..  Objective:    Vitals:   07/28/20 2022 07/29/20 0500 07/29/20 0753  07/29/20 1209  BP: (!) 156/104 (!) 142/91 110/78 105/79  Pulse: 95 83 79 73  Resp: Temp: 97.6 F (36.4 C) 98.4 F (36.9 C) 98.2 F (36.8 C) 97.8 F (36.6 C)  TempSrc: Oral Oral Oral   SpO2: 96% 96% 93% 94%  Weight:  64.6 kg    Height:       No data found.   Intake/Output Summary (Last 24 hours) at 07/29/2020 1232 Last data filed at 07/29/2020 1036 Gross per 24 hour  Intake 470 ml  Output 2000 ml  Net -1530 ml   Filed Weights   07/27/20 0427 07/28/20 0400 07/29/20 0500  Weight: 67.2 kg 68.5 kg 64.6 kg    Exam:   GEN: NAD SKIN: Warm and dry EYES: EOMI ENT: MMM CV: RRR PULM: CTA B ABD: soft, ND, NT, +BS CNS: AAO x 1 (person), right upper extremity monoplegia with contracture EXT: No edema or tenderness       Data Reviewed:   I have personally reviewed following labs and imaging studies:  Labs: Labs show the following:   Basic Metabolic Panel: Recent Labs  Lab 07/26/20 0935  NA 135  K 3.7  CL 102  CO2 26  GLUCOSE 102*  BUN 13  CREATININE 0.75  CALCIUM 8.6*   GFR Estimated Creatinine Clearance: 66.5 mL/min (by C-G formula based on SCr of 0.75 mg/dL). Liver Function Tests: Recent Labs  Lab 07/26/20 1006  AST 30  ALT 15  ALKPHOS 79  BILITOT 1.0  PROT 6.3*  ALBUMIN 3.5   No results for input(s): LIPASE, AMYLASE in the last 168 hours. No results for input(s): AMMONIA in the last 168 hours. Coagulation profile Recent Labs  Lab 07/26/20 1006  INR 1.9*    CBC: Recent Labs  Lab 07/26/20 0935  WBC 7.1  HGB 14.0  HCT 41.6  MCV 97.7  PLT 142*   Cardiac Enzymes: No results for input(s): CKTOTAL, CKMB, CKMBINDEX, TROPONINI in the last 168 hours. BNP (last 3 results) No results for input(s): PROBNP in the last 8760 hours. CBG: No results for input(s): GLUCAP in the last 168 hours. D-Dimer: No results for input(s): DDIMER in the last 72 hours. Hgb A1c: No results for input(s): HGBA1C in the last 72 hours. Lipid  Profile: No results for input(s): CHOL, HDL, LDLCALC, TRIG, CHOLHDL, LDLDIRECT in  the last 72 hours. Thyroid function studies: No results for input(s): TSH, T4TOTAL, T3FREE, THYROIDAB in the last 72 hours.  Invalid input(s): FREET3 Anemia work up: No results for input(s): VITAMINB12, FOLATE, FERRITIN, TIBC, IRON, RETICCTPCT in the last 72 hours. Sepsis Labs: Recent Labs  Lab 07/26/20 0935 07/26/20 1006  WBC 7.1  --   LATICACIDVEN  --  1.0    Microbiology Recent Results (from the past 240 hour(s))  Urine culture     Status: None   Collection Time: 07/26/20  9:35 AM   Specimen: In/Out Cath Urine  Result Value Ref Range Status   Specimen Description   Final    IN/OUT CATH URINE Performed at [facility], [address], [place], [place] [address]    Special Requests   Final    NONE Performed at [facility], [address] [place], [place] [address]    Culture   Final    NO GROWTH Performed at [facility], [address] [place], [place] [address]    Report Status 07/27/2020 FINAL  Final  Blood culture (routine single)     Status: Abnormal   Collection Time: 07/26/20 10:06 AM   Specimen: BLOOD  Result Value Ref Range Status   Specimen Description   Final    BLOOD BLOOD RIGHT HAND Performed at [facility], [address] [place], [place] [address]    Special Requests   Final    BOTTLES DRAWN AEROBIC AND ANAEROBIC Blood Culture results may not be optimal due to an inadequate volume of blood received in culture bottles Performed at [facility], [address], [place], [place] [address]    Culture  Setup Time   Final    GRAM POSITIVE COCCI ANAEROBIC BOTTLE ONLY Organism ID to follow CRITICAL RESULT CALLED TO, READ BACK BY AND VERIFIED WITH: SCOTT HALL AT 0631 ON 07/27/2020 [facility]. Performed at [facility], [address] [place], [place] [address]    Culture (A)  Final    STAPHYLOCOCCUS EPIDERMIDIS THE  SIGNIFICANCE OF ISOLATING THIS ORGANISM FROM A SINGLE SET OF BLOOD CULTURES WHEN MULTIPLE SETS ARE DRAWN IS UNCERTAIN. PLEASE NOTIFY THE MICROBIOLOGY DEPARTMENT WITHIN ONE WEEK IF SPECIATION AND SENSITIVITIES ARE REQUIRED. Performed at [facility], [address] [place] [address], [place], [place] [address]    Report Status 07/29/2020 FINAL  Final  Blood culture (routine x 2)     Status: None (Preliminary result)   Collection Time: 07/26/20 10:06 AM   Specimen: BLOOD  Result Value Ref Range Status   Specimen Description BLOOD LEFT ANTECUBITAL  Final   Special Requests   Final    BOTTLES DRAWN AEROBIC AND ANAEROBIC Blood Culture adequate volume   Culture   Final    NO GROWTH 3 DAYS Performed at [facility], [address] [place], [place] [address]    Report Status PENDING  Incomplete  Blood Culture ID Panel (Reflexed)     Status: Abnormal   Collection Time: 07/26/20 10:06 AM  Result Value Ref Range Status   Enterococcus faecalis NOT DETECTED NOT DETECTED Final   Enterococcus Faecium NOT DETECTED NOT DETECTED Final   Listeria monocytogenes NOT DETECTED NOT DETECTED Final   Staphylococcus species DETECTED (A) NOT DETECTED Final    Comment: CRITICAL RESULT CALLED TO, READ BACK BY AND VERIFIED WITH: SCOTT HALL AT 0631 ON 07/27/2020 [facility].    Staphylococcus aureus (BCID) NOT DETECTED NOT DETECTED Final   Staphylococcus epidermidis DETECTED (A) NOT  DETECTED Final    Comment: Methicillin (oxacillin) resistant coagulase negative staphylococcus. Possible blood culture contaminant (unless isolated from more than one blood culture draw or clinical case suggests pathogenicity). No antibiotic treatment is indicated for blood  culture contaminants. CRITICAL RESULT CALLED TO, READ BACK BY AND VERIFIED WITH: SCOTT HALL AT 0631 ON 07/27/2020 [facility].    Staphylococcus lugdunensis NOT DETECTED NOT DETECTED Final   Streptococcus species NOT DETECTED NOT DETECTED Final   Streptococcus agalactiae NOT  DETECTED NOT DETECTED Final   Streptococcus pneumoniae NOT DETECTED NOT DETECTED Final   Streptococcus pyogenes NOT DETECTED NOT DETECTED Final   A.calcoaceticus-baumannii NOT DETECTED NOT DETECTED Final   Bacteroides fragilis NOT DETECTED NOT DETECTED Final   Enterobacterales NOT DETECTED NOT DETECTED Final   Enterobacter cloacae complex NOT DETECTED NOT DETECTED Final   Escherichia coli NOT DETECTED NOT DETECTED Final   Klebsiella aerogenes NOT DETECTED NOT DETECTED Final   Klebsiella oxytoca NOT DETECTED NOT DETECTED Final   Klebsiella pneumoniae NOT DETECTED NOT DETECTED Final   Proteus species NOT DETECTED NOT DETECTED Final   Salmonella species NOT DETECTED NOT DETECTED Final   Serratia marcescens NOT DETECTED NOT DETECTED Final   Haemophilus influenzae NOT DETECTED NOT DETECTED Final   Neisseria meningitidis NOT DETECTED NOT DETECTED Final   Pseudomonas aeruginosa NOT DETECTED NOT DETECTED Final   Stenotrophomonas maltophilia NOT DETECTED NOT DETECTED Final   Candida albicans NOT DETECTED NOT DETECTED Final   Candida auris NOT DETECTED NOT DETECTED Final   Candida glabrata NOT DETECTED NOT DETECTED Final   Candida krusei NOT DETECTED NOT DETECTED Final   Candida parapsilosis NOT DETECTED NOT DETECTED Final   Candida tropicalis NOT DETECTED NOT DETECTED Final   Cryptococcus neoformans/gattii NOT DETECTED NOT DETECTED Final   Methicillin resistance mecA/C DETECTED (A) NOT DETECTED Final    Comment: CRITICAL RESULT CALLED TO, READ BACK BY AND VERIFIED WITH: SCOTT HALL AT 0631 ON 07/27/2020 [facility]. Performed at [facility], [address] [place], [place] [address]   Respiratory Panel by RT PCR (Flu A&B, Covid) -     Status: None   Collection Time: 07/26/20 12:01 PM   Specimen: Nasopharyngeal  Result Value Ref Range Status   SARS Coronavirus 2 by RT PCR NEGATIVE NEGATIVE Final    Comment: (NOTE) SARS-CoV-2 target nucleic acids are NOT DETECTED.  The SARS-CoV-2 RNA is  generally detectable in upper respiratoy specimens during the acute phase of infection. The lowest concentration of SARS-CoV-2 viral copies this assay can detect is 131 copies/mL. A negative result does not preclude SARS-Cov-2 infection and should not be used as the sole basis for treatment or other patient management decisions. A negative result may occur with  improper specimen collection/handling, submission of specimen other than nasopharyngeal swab, presence of viral mutation(s) within the areas targeted by this assay, and inadequate number of viral copies (<131 copies/mL). A negative result must be combined with clinical observations, patient history, and epidemiological information. The expected result is Negative.  Fact Sheet for Patients:  https://www.moore.com/  Fact Sheet for Healthcare Providers:  https://www.young.biz/  This test is no t yet approved or cleared by the [place] FDA and  has been authorized for detection and/or diagnosis of SARS-CoV-2 by FDA under an Emergency Use Authorization (EUA). This EUA will remain  in effect (meaning this test can be used) for the duration of the COVID-19 declaration under Section 564(b)(1) of the Act, 21 U.S.C. section 360bbb-3(b)(1), unless the authorization is terminated or revoked sooner.  Influenza A by PCR NEGATIVE NEGATIVE Final   Influenza B by PCR NEGATIVE NEGATIVE Final    Comment: (NOTE) The Xpert Xpress SARS-CoV-2/FLU/RSV assay is intended as an aid in  the diagnosis of influenza from Nasopharyngeal swab specimens and  should not be used as a sole basis for treatment. Nasal washings and  aspirates are unacceptable for Xpert Xpress SARS-CoV-2/FLU/RSV  testing.  Fact Sheet for Patients: https://www.moore.com/https://www.fda.gov/media/142436/download  Fact Sheet for Healthcare Providers: https://www.young.biz/https://www.fda.gov/media/142435/download  This test is not yet approved or cleared by the [place] FDA and  has been authorized for detection and/or diagnosis of SARS-CoV-2 by  FDA under an Emergency Use Authorization (EUA). This EUA will remain  in effect (meaning this test can be used) for the duration of the  Covid-19 declaration under Section 564(b)(1) of the Act, 21  U.S.C. section 360bbb-3(b)(1), unless the authorization is  terminated or revoked. Performed at [facility], [address] [place], [place] [address]   Culture, blood (single) w Reflex to ID Panel     Status: None (Preliminary result)   Collection Time: 07/28/20  8:09 AM   Specimen: BLOOD  Result Value Ref Range Status   Specimen Description BLOOD LFOA  Final   Special Requests   Final    BOTTLES DRAWN AEROBIC AND ANAEROBIC Blood Culture results may not be optimal due to an excessive volume of blood received in culture bottles   Culture   Final    NO GROWTH < 24 HOURS Performed at [facility], [address] [place], [place] [address]    Report Status PENDING  Incomplete    Procedures and diagnostic studies:  No results found.             LOS: 3 days   Tonisha Silvey  [facility]   Pager on [email or web address]. If 7PM-7AM, please contact night-coverage at [email or web address]     07/29/2020, 12:32 PM

## 2020-07-29 NOTE — Plan of Care (Signed)

## 2020-07-30 LAB — RESPIRATORY PANEL BY RT PCR (FLU A&B, COVID)
Influenza A by PCR: NEGATIVE
Influenza B by PCR: NEGATIVE
SARS Coronavirus 2 by RT PCR: NEGATIVE

## 2020-07-30 MED ORDER — AMOXICILLIN-POT CLAVULANATE 875-125 MG PO TABS: 1.0000 | ORAL_TABLET | Freq: Two times a day (BID) | ORAL | 0 refills | Status: AC

## 2020-07-30 NOTE — Care Management Important Message (Signed)
Important Message  Patient Details  Name: Duane Guzman MRN: [id number] Date of Birth: [birth year]   Medicare Important Message Given:  Yes     Johnell Comings 07/30/2020, 12:16 PM

## 2020-07-30 NOTE — Discharge Summary (Addendum)
Physician Discharge Summary  Duane Guzman WUJ:[id number] DOB: Nov 14, 1940 DOA: 07/26/2020  PCP: Patient, No Pcp Per  Admit date: 07/26/2020 Discharge date: 07/30/2020  Discharge disposition: Skilled nursing facility   Recommendations for Outpatient Follow-Up:   Follow-up with physician at the nursing home within 3 days of discharge   Discharge Diagnosis:   Principal Problem:   CAP (community acquired pneumonia) Active Problems:   CVA (cerebral vascular accident) (HCC)   Dementia (HCC)   Encephalopathy acute    Discharge Condition: Stable.  Diet recommendation:  Diet Order            Diet - low sodium heart healthy           DIET DYS 3           DIET DYS 3 Room service appropriate? Yes with Assist; Fluid consistency: Thin  Diet effective now                   Code Status: DNR     Hospital Course:   Mr. Duane Guzman is an 79 y.o. male  with medical history significant for stroke, vascular dementia, COPD, paroxysmal atrial fibrillation on Xarelto, seizure disorder, hypertension, hyperlipidemia, depression, anxiety, GERD, hiatal hernia.  He was brought to the hospital because of generalized weakness, difficulty ambulating, difficulty swallowing and increasing confusion.  Patient had a low-grade fever with temperature of 100.1 when EMS picked him up.  He was hypotensive in the emergency room with BP of 81/59.  Chest x-ray showed patchy left lung base opacity concerning for pneumonia.  He was admitted to the hospital for community-acquired pneumonia.  He was treated with IV fluids and empiric IV antibiotics.  Initially, there was concern for sepsis but patient did not meet sepsis criteria (thus sepsis was ruled out).  1 out of 4 blood culture bottles showed staph epidermidis.  This was thought to be a contaminant.  There was no growth on repeat blood culture.  He was evaluated by the speech therapist and dysphagia 3 diet was recommended for dysphagia.  He was also  evaluated by PT and OT recommended further rehabilitation at a skilled nursing facility.  His condition has improved and is deemed stable for discharge to SNF today.     Discharge Exam:    Vitals:   07/29/20 2239 07/30/20 0411 07/30/20 0756 07/30/20 1123  BP:  108/68 114/76 105/70  Pulse:  75 77 89  Resp: Temp:  97.6 F (36.4 C) 98.4 F (36.9 C) (!) 97.5 F (36.4 C)  TempSrc: Oral Oral  Oral  SpO2:  93% 94% 93%  Weight:  65.1 kg    Height:         GEN: NAD SKIN: Warm and dry EYES: EOMI ENT: MMM CV: RRR PULM: CTA B ABD: soft, ND, NT, +BS CNS: AAO x1 (person).  Right upper extremity monoplegia with contractures EXT: No edema or tenderness   The results of significant diagnostics from this hospitalization (including imaging, microbiology, ancillary and laboratory) are listed below for reference.     Procedures and Diagnostic Studies:   CT ABDOMEN PELVIS WO CONTRAST  Result Date: 07/26/2020 CLINICAL DATA:  Acute nonlocalized abdominal pain.  Weakness. EXAM: CT ABDOMEN AND PELVIS WITHOUT CONTRAST TECHNIQUE: Multidetector CT imaging of the abdomen and pelvis was performed following the standard protocol without IV contrast. COMPARISON:  None. FINDINGS: Lower chest: Patchy atelectasis and or infiltrate in both lower lungs. Small amount of pleural fluid left more than  right. Extensive coronary artery calcification. Hiatal hernia containing fat and portions of the stomach. Hepatobiliary: Normal without contrast. Pancreas: Normal Spleen: Normal Adrenals/Urinary Tract: Adrenal glands are normal. Kidneys are normal except for a simple appearing cyst at the lower pole on the right measuring 2.5 cm in diameter. No hydronephrosis, stone or mass. Small bladder diverticula. Stomach/Bowel: Hiatal hernia as noted above containing portions of the stomach. No sign of obstruction. No small bowel pathology is evident. No acute or significant: Finding. Diverticulosis without CT  evidence of diverticulitis. Vascular/Lymphatic: Aortic atherosclerosis. No aneurysm. IVC is normal. No retroperitoneal adenopathy. Reproductive: Normal Other: Small amount of intraperitoneal fluid without loculation. No free air. Musculoskeletal: Chronic curvature in degenerative changes of the spine. IMPRESSION: 1. Patchy atelectasis and or infiltrate in both lower lungs. Small amount of pleural fluid left more than right. 2. Hiatal hernia containing fat and portions of the stomach. No sign of obstruction. 3. Aortic atherosclerosis. Coronary artery calcification. 4. Diverticulosis without CT evidence of diverticulitis. Low level diverticulitis can be inapparent at imaging. 5. Small amount of intraperitoneal fluid without loculation. Aortic Atherosclerosis (ICD10-I70.0). Electronically Signed   By: Paulina Fusi M.D.   On: 07/26/2020 13:49   CT HEAD WO CONTRAST  Result Date: 07/26/2020 CLINICAL DATA:  Worsening weakness EXAM: CT HEAD WITHOUT CONTRAST TECHNIQUE: Contiguous axial images were obtained from the base of the skull through the vertex without intravenous contrast. COMPARISON:  None. FINDINGS: Brain: There is no acute intracranial hemorrhage, mass effect, or edema. No acute appearing loss of gray-white differentiation. There are multiple chronic infarcts including involvement of the left frontal, parietal, and occipital lobes, right occipital lobe, and left lateral cerebellum. Additional confluent areas hypoattenuation in the supratentorial white nonspecific but probably reflect moderate chronic microvascular ischemic changes. Prominence of the ventricles and sulci reflects parenchymal volume loss. There is no extra-axial fluid collection. Vascular: There is atherosclerotic calcification at the skull base. Skull: Calvarium is unremarkable. Sinuses/Orbits: No acute finding. Other: None. IMPRESSION: No acute intracranial abnormality. Multiple chronic infarcts. As there is no comparison available, subtle  superimposed acute infarct difficult to exclude. Electronically Signed   By: Guadlupe Spanish M.D.   On: 07/26/2020 13:49   CT CHEST WO CONTRAST  Result Date: 07/26/2020 CLINICAL DATA:  Pneumonia EXAM: CT CHEST WITHOUT CONTRAST TECHNIQUE: Multidetector CT imaging of the chest was performed following the standard protocol without IV contrast. COMPARISON:  Chest radiograph July 26, 2020. FINDINGS: Cardiovascular: Ascending thoracic aortic diameter measures 4.0 x 4.0 cm. There are scattered foci of calcification in proximal visualized great vessels. Note that the right innominate and left common carotid arteries arise as a common trunk, an anatomic variant. There is aortic atherosclerosis as well as multiple foci of coronary artery calcification. No appreciable pericardial effusion or pericardial thickening. Mediastinum/Nodes: Thyroid appears unremarkable. There is no appreciable lymph node enlargement in the thoracic region. Several subcentimeter mediastinal lymph nodes are seen which do not meet size criteria for pathologic significance. There is a fairly sizable paraesophageal hernia with much of the stomach above the diaphragm. Lungs/Pleura: There is underlying centrilobular and paraseptal emphysematous change. There are scattered areas of fibrosis in the periphery of the lungs bilaterally. There is patchy atelectatic change associated with scarring and fibrosis in the lung bases. There is no appreciable airspace consolidation. There is mild lower lobe bronchiectatic change. No appreciable pleural effusions. Upper Abdomen: There is upper abdominal aortic atherosclerosis. Visualized upper abdominal structures otherwise appear unremarkable. Musculoskeletal: There is degenerative change in the thoracic spine. There are  no blastic or lytic bone lesions. Apparent loop recorder on the left anteriorly. IMPRESSION: 1. Extensive centrilobular and paraseptal emphysematous changes. Scattered areas of fibrosis and  atelectasis. No frank consolidation. Mild lower lobe bronchiectatic change. It should be noted that subtle infiltrate in the areas of scarring and atelectasis in the lung bases is difficult to exclude with certainty. 2.  No evident adenopathy. 3. Sizable paraesophageal type hernia with much of the stomach above the diaphragm. 4. Ascending thoracic aortic diameter measures 4.0 x 4.0 cm. Recommend annual imaging followup by CTA or MRA. This recommendation follows 2010 ACCF/AHA/AATS/ACR/ASA/SCA/SCAI/SIR/STS/SVM Guidelines for the Diagnosis and Management of Patients with Thoracic Aortic Disease. Circulation. 2010; 121: F595-Z967. Aortic aneurysm NOS (ICD10-I71.9). Foci of aortic atherosclerosis as well as foci of great vessel and coronary artery calcification noted. Aortic Atherosclerosis (ICD10-I70.0) and Emphysema (ICD10-J43.9). Electronically Signed   By: Bretta Bang III M.D.   On: 07/26/2020 12:41   DG Chest Port 1 View  Result Date: 07/26/2020 CLINICAL DATA:  Weakness, possible sepsis EXAM: PORTABLE CHEST 1 VIEW COMPARISON:  None. FINDINGS: Loop recorder overlies the lower left chest. Top-normal heart size. Atherosclerotic aortic arch. Otherwise normal mediastinal contour. No pneumothorax. No right pleural effusion. Mild blunting of the left costophrenic angle cannot exclude small left pleural effusion. Patchy left lung base opacity. No pulmonary edema. IMPRESSION: 1. Patchy left lung base opacity, suspicious for pneumonia, with differential including aspiration or atelectasis. PA and lateral chest radiograph follow-up advised. 2. Possible small left pleural effusion. Electronically Signed   By: Delbert Phenix M.D.   On: 07/26/2020 10:02     Labs:   Basic Metabolic Panel: Recent Labs  Lab 07/26/20 0935  NA 135  K 3.7  CL 102  CO2 26  GLUCOSE 102*  BUN 13  CREATININE 0.75  CALCIUM 8.6*   GFR Estimated Creatinine Clearance: 66.5 mL/min (by C-G formula based on SCr of 0.75 mg/dL). Liver  Function Tests: Recent Labs  Lab 07/26/20 1006  AST 30  ALT 15  ALKPHOS 79  BILITOT 1.0  PROT 6.3*  ALBUMIN 3.5   No results for input(s): LIPASE, AMYLASE in the last 168 hours. No results for input(s): AMMONIA in the last 168 hours. Coagulation profile Recent Labs  Lab 07/26/20 1006  INR 1.9*    CBC: Recent Labs  Lab 07/26/20 0935  WBC 7.1  HGB 14.0  HCT 41.6  MCV 97.7  PLT 142*   Cardiac Enzymes: No results for input(s): CKTOTAL, CKMB, CKMBINDEX, TROPONINI in the last 168 hours. BNP: Invalid input(s): POCBNP CBG: No results for input(s): GLUCAP in the last 168 hours. D-Dimer No results for input(s): DDIMER in the last 72 hours. Hgb A1c No results for input(s): HGBA1C in the last 72 hours. Lipid Profile No results for input(s): CHOL, HDL, LDLCALC, TRIG, CHOLHDL, LDLDIRECT in the last 72 hours. Thyroid function studies No results for input(s): TSH, T4TOTAL, T3FREE, THYROIDAB in the last 72 hours.  Invalid input(s): FREET3 Anemia work up No results for input(s): VITAMINB12, FOLATE, FERRITIN, TIBC, IRON, RETICCTPCT in the last 72 hours. Microbiology Recent Results (from the past 240 hour(s))  Urine culture     Status: None   Collection Time: 07/26/20  9:35 AM   Specimen: In/Out Cath Urine  Result Value Ref Range Status   Specimen Description   Final    IN/OUT CATH URINE Performed at [facility], [address] [place], [place] [address]    Special Requests   Final    NONE Performed  at [facility], [address], [place], [place] [address]    Culture   Final    NO GROWTH Performed at [facility], [address], [place], [place] [address]    Report Status 07/27/2020 FINAL  Final  Blood culture (routine single)     Status: Abnormal   Collection Time: 07/26/20 10:06 AM   Specimen: BLOOD  Result Value Ref Range Status   Specimen Description   Final    BLOOD BLOOD RIGHT HAND Performed at [facility], [address] [place], [place] [address]    Special Requests   Final    BOTTLES DRAWN AEROBIC AND ANAEROBIC Blood Culture results may not be optimal due to an inadequate volume of blood received in culture bottles Performed at [facility], [address] [place], [place] [address]    Culture  Setup Time   Final    GRAM POSITIVE COCCI ANAEROBIC BOTTLE ONLY Organism ID to follow CRITICAL RESULT CALLED TO, READ BACK BY AND VERIFIED WITH: SCOTT HALL AT 0631 ON 07/27/2020 [facility]. Performed at [facility], [address] [place], [place] [address]    Culture (A)  Final    STAPHYLOCOCCUS EPIDERMIDIS THE SIGNIFICANCE OF ISOLATING THIS ORGANISM FROM A SINGLE SET OF BLOOD CULTURES WHEN MULTIPLE SETS ARE DRAWN IS UNCERTAIN. PLEASE NOTIFY THE MICROBIOLOGY DEPARTMENT WITHIN ONE WEEK IF SPECIATION AND SENSITIVITIES ARE REQUIRED. Performed at [facility], [address], [place], [place] [address]    Report Status 07/29/2020 FINAL  Final  Blood culture (routine x 2)     Status: None (Preliminary result)   Collection Time: 07/26/20 10:06 AM   Specimen: BLOOD  Result Value Ref Range Status   Specimen Description BLOOD LEFT ANTECUBITAL  Final   Special Requests   Final    BOTTLES DRAWN AEROBIC AND ANAEROBIC Blood Culture adequate volume   Culture   Final    NO GROWTH 4 DAYS Performed at [facility], [address] [place], [place] [address]    Report Status PENDING  Incomplete  Blood Culture ID Panel (Reflexed)     Status: Abnormal   Collection Time: 07/26/20 10:06 AM  Result Value Ref Range Status   Enterococcus faecalis NOT DETECTED NOT DETECTED Final   Enterococcus Faecium NOT DETECTED NOT DETECTED Final   Listeria monocytogenes NOT DETECTED NOT DETECTED Final   Staphylococcus species DETECTED (A) NOT DETECTED Final    Comment: CRITICAL RESULT CALLED TO, READ BACK BY AND VERIFIED WITH: SCOTT HALL AT 0631 ON 07/27/2020 [facility].    Staphylococcus aureus  (BCID) NOT DETECTED NOT DETECTED Final   Staphylococcus epidermidis DETECTED (A) NOT DETECTED Final    Comment: Methicillin (oxacillin) resistant coagulase negative staphylococcus. Possible blood culture contaminant (unless isolated from more than one blood culture draw or clinical case suggests pathogenicity). No antibiotic treatment is indicated for blood  culture contaminants. CRITICAL RESULT CALLED TO, READ BACK BY AND VERIFIED WITH: SCOTT HALL AT 0631 ON 07/27/2020 [facility].    Staphylococcus lugdunensis NOT DETECTED NOT DETECTED Final   Streptococcus species NOT DETECTED NOT DETECTED Final   Streptococcus agalactiae NOT DETECTED NOT DETECTED Final   Streptococcus pneumoniae NOT DETECTED NOT DETECTED Final   Streptococcus pyogenes NOT DETECTED NOT DETECTED Final   A.calcoaceticus-baumannii NOT DETECTED NOT DETECTED Final   Bacteroides fragilis NOT DETECTED NOT DETECTED Final   Enterobacterales NOT DETECTED NOT DETECTED Final   Enterobacter cloacae complex NOT DETECTED NOT DETECTED Final   Escherichia  coli NOT DETECTED NOT DETECTED Final   Klebsiella aerogenes NOT DETECTED NOT DETECTED Final   Klebsiella oxytoca NOT DETECTED NOT DETECTED Final   Klebsiella pneumoniae NOT DETECTED NOT DETECTED Final   Proteus species NOT DETECTED NOT DETECTED Final   Salmonella species NOT DETECTED NOT DETECTED Final   Serratia marcescens NOT DETECTED NOT DETECTED Final   Haemophilus influenzae NOT DETECTED NOT DETECTED Final   Neisseria meningitidis NOT DETECTED NOT DETECTED Final   Pseudomonas aeruginosa NOT DETECTED NOT DETECTED Final   Stenotrophomonas maltophilia NOT DETECTED NOT DETECTED Final   Candida albicans NOT DETECTED NOT DETECTED Final   Candida auris NOT DETECTED NOT DETECTED Final   Candida glabrata NOT DETECTED NOT DETECTED Final   Candida krusei NOT DETECTED NOT DETECTED Final   Candida parapsilosis NOT DETECTED NOT DETECTED Final   Candida tropicalis NOT DETECTED NOT DETECTED Final    Cryptococcus neoformans/gattii NOT DETECTED NOT DETECTED Final   Methicillin resistance mecA/C DETECTED (A) NOT DETECTED Final    Comment: CRITICAL RESULT CALLED TO, READ BACK BY AND VERIFIED WITH: SCOTT HALL AT 0631 ON 07/27/2020 [facility]. Performed at [facility], [address] [place], [place] [address]   Respiratory Panel by RT PCR (Flu A&B, Covid) -     Status: None   Collection Time: 07/26/20 12:01 PM   Specimen: Nasopharyngeal  Result Value Ref Range Status   SARS Coronavirus 2 by RT PCR NEGATIVE NEGATIVE Final    Comment: (NOTE) SARS-CoV-2 target nucleic acids are NOT DETECTED.  The SARS-CoV-2 RNA is generally detectable in upper respiratoy specimens during the acute phase of infection. The lowest concentration of SARS-CoV-2 viral copies this assay can detect is 131 copies/mL. A negative result does not preclude SARS-Cov-2 infection and should not be used as the sole basis for treatment or other patient management decisions. A negative result may occur with  improper specimen collection/handling, submission of specimen other than nasopharyngeal swab, presence of viral mutation(s) within the areas targeted by this assay, and inadequate number of viral copies (<131 copies/mL). A negative result must be combined with clinical observations, patient history, and epidemiological information. The expected result is Negative.  Fact Sheet for Patients:  https://www.moore.com/https://www.fda.gov/media/142436/download  Fact Sheet for Healthcare Providers:  https://www.young.biz/https://www.fda.gov/media/142435/download  This test is no t yet approved or cleared by the [place] FDA and  has been authorized for detection and/or diagnosis of SARS-CoV-2 by FDA under an Emergency Use Authorization (EUA). This EUA will remain  in effect (meaning this test can be used) for the duration of the COVID-19 declaration under Section 564(b)(1) of the Act, 21 U.S.C. section 360bbb-3(b)(1), unless the authorization is  terminated or revoked sooner.     Influenza A by PCR NEGATIVE NEGATIVE Final   Influenza B by PCR NEGATIVE NEGATIVE Final    Comment: (NOTE) The Xpert Xpress SARS-CoV-2/FLU/RSV assay is intended as an aid in  the diagnosis of influenza from Nasopharyngeal swab specimens and  should not be used as a sole basis for treatment. Nasal washings and  aspirates are unacceptable for Xpert Xpress SARS-CoV-2/FLU/RSV  testing.  Fact Sheet for Patients: https://www.moore.com/https://www.fda.gov/media/142436/download  Fact Sheet for Healthcare Providers: https://www.young.biz/https://www.fda.gov/media/142435/download  This test is not yet approved or cleared by the [place] FDA and  has been authorized for detection and/or diagnosis of SARS-CoV-2 by  FDA under an Emergency Use Authorization (EUA). This EUA will remain  in effect (meaning this test can be used) for the duration of the  Covid-19 declaration under Section 564(b)(1) of  the Act, 21  U.S.C. section 360bbb-3(b)(1), unless the authorization is  terminated or revoked. Performed at [facility], [address] [place], [place] [address]   Culture, blood (single) w Reflex to ID Panel     Status: None (Preliminary result)   Collection Time: 07/28/20  8:09 AM   Specimen: BLOOD  Result Value Ref Range Status   Specimen Description BLOOD LFOA  Final   Special Requests   Final    BOTTLES DRAWN AEROBIC AND ANAEROBIC Blood Culture results may not be optimal due to an excessive volume of blood received in culture bottles   Culture   Final    NO GROWTH 2 DAYS Performed at [facility], [address], [place], [place] [address]    Report Status PENDING  Incomplete     Discharge Instructions:   Discharge Instructions    DIET DYS 3   Complete by: As directed    Fluid consistency: Thin   Diet - low sodium heart healthy   Complete by: As directed    Increase activity slowly   Complete by: As directed      Allergies as of 07/30/2020   No Known  Allergies     Medication List    STOP taking these medications   guaiFENesin 600 MG 12 hr tablet Commonly known as: MUCINEX     TAKE these medications   amoxicillin-clavulanate 875-125 MG tablet Commonly known as: AUGMENTIN Take 1 tablet by mouth every 12 (twelve) hours for 3 doses.   ARIPiprazole 5 MG tablet Commonly known as: ABILIFY Take 2.5 mg by mouth daily at 12 noon.   atorvastatin 10 MG tablet Commonly known as: LIPITOR Take 10 mg by mouth at bedtime.   citalopram 20 MG tablet Commonly known as: CELEXA Take 20 mg by mouth daily.   clonazePAM 0.5 MG tablet Commonly known as: KLONOPIN Take 0.25 mg by mouth daily as needed for anxiety.   [facility] Senna Plus 8.6-50 MG tablet Generic drug: senna-docusate Take 2 tablets by mouth at bedtime.   gabapentin 300 MG capsule Commonly known as: NEURONTIN Take 300 mg by mouth every evening.   levETIRAcetam 1000 MG tablet Commonly known as: KEPPRA Take 500-1,000 mg by mouth See admin instructions. Take  tablet (500mg ) by mouth every morning and take 1 tablet (1000mg ) by mouth every night at bedtime   mirtazapine 7.5 MG tablet Commonly known as: REMERON Take 3.75 mg by mouth daily.   pantoprazole 40 MG tablet Commonly known as: PROTONIX Take 40 mg by mouth daily at 6 (six) AM.   vitamin B-12 1000 MCG tablet Commonly known as: CYANOCOBALAMIN Take 1,000 mcg by mouth daily.   Vitamin D (Ergocalciferol) 1.25 MG (50000 UNIT) Caps capsule Commonly known as: DRISDOL Take 50,000 Units by mouth every Monday.   Xarelto 20 MG Tabs tablet Generic drug: rivaroxaban Take 20 mg by mouth every evening.         Time coordinating discharge: 31 minutes  Signed:  Cannen Dupras  [facility] 07/30/2020, 12:30 PM   Pager on [email or web address]. If 7PM-7AM, please contact night-coverage at [email or web address]

## 2020-07-31 LAB — CULTURE, BLOOD (ROUTINE X 2)
Culture: NO GROWTH
Special Requests: ADEQUATE

## 2020-08-02 LAB — CULTURE, BLOOD (SINGLE): Culture: NO GROWTH

## 2020-10-31 ENCOUNTER — Ambulatory Visit: Attending: Infectious Diseases

## 2020-10-31 ENCOUNTER — Other Ambulatory Visit: Payer: Self-pay

## 2020-10-31 DIAGNOSIS — M6281 Muscle weakness (generalized): Secondary | ICD-10-CM

## 2020-10-31 NOTE — Therapy (Signed)
[place] [facility] MAIN [facility] SERVICES [address] [place], [place], [address] Phone: [phone]   Fax:  [phone]  Physical Therapy Treatment  Patient Details  Name: Duane Guzman MRN: [id number] Date of Birth: August 12, 1941 No data recorded  Encounter Date: 10/31/2020   PT End of Session - 10/31/20 1242    Visit Number 1    Number of Visits 17    Date for PT Re-Evaluation 12/26/20    PT Start Time 1102    PT Stop Time 1152    PT Time Calculation (min) 50 min    Equipment Utilized During Treatment Gait belt    Activity Tolerance Patient tolerated treatment well    Behavior During Therapy --   Patient in and out of sleep during evaluation          Past Medical History:  Diagnosis Date  . Dementia (HCC)   . Stroke [facility])     Past Surgical History:  Procedure Laterality Date  . APPENDECTOMY      There were no vitals filed for this visit.   Subjective Assessment - 10/31/20 1108    Subjective Patient presents sleeping in the wheelchair, the patient's partner giving history.  The patient was having more difficulty walking October 27th and on the 28th was unable to walk.  The patient went to the ED via ambulance and was treated for pneumonia.  The patient was admitted to the hospital from October 28-November 1 for the pneumonia.  The patient was seen by speech, PT and OT in the hospital and was discharged to a skilled nursing facility.  The patient has a history of CVAs with chronic right hemiplegia and cognitive issues.  The patient currently has hospice services at home who are tending to bilateral heel ulcers.  The patient is under the care of a podiatry for these.  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  The patient's partner is concerned that he is not standing or walking.  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  He does have one in his right  hand as well since his stroke but his LE is newly developed.    Patient is accompained by: Family member    How long can you sit comfortably? unlimited    How long can you stand comfortably? unable    How long can you walk comfortably? unable    Patient Stated Goals patient's partner would like the patient to be more comfortable by improving contractures, and ability to stand for transfers and washing, walk with assistance approximately 30 feet.          BP 116/94 SpO2 95% HR 116 Bilateral feet wrapped with Ace bandages and Coban    Patient with eyes closed in chair during majority of evaluation  POSTURE: Seated: weight shift onto R hip  Standing: unable to fully stand, with max A x2 patient able to elevate approximately 6 inches from wheelchair seat.  The patient was able to provide minor assistance with left UE.    PROM: LE prom tested in sitting position Limited left knee extension lacking approximately 20 degrees with max overpressure, right knee extension to near neutral   AROM BLE/Strength Patient able to rise up partially onto toes in sitting position with moderate cueing Left knee extension to 30 degrees, Right knee to 45 degrees Unable to flex hip bilaterally Unable to provide resistance for  seated hamstring testing, hip abduction and adduction    SOMATOSENSORY: Any N & T in extremities:  None reported     FUNCTIONAL MOBILITY:  Neutral sittig in wheel chair:  Required max A  STS: heavy verbal cueing for position, unable to stand patient with max A Supine <>sit:  Not assessed   BALANCE:     Static Sitting Balance  Normal Able to maintain balance against maximal resistance    Good Able to maintain balance against moderate resistance    Good-/Fair+ Accepts minimal resistance   Fair Able to sit unsupported without balance loss and without UE support    Poor+ Able to maintain with Minimal assistance from individual or chair    Poor Unable to maintain  balance-requires mod/max support from individual or chair  X      GAIT: Patient nonambulatory   OUTCOME MEASURES: TEST Outcome Interpretation  FOTO 10% Predicted discharge score of 34%    TREATMENT  Passive bilateral knee extension LAQ x5 reps each with heavy verbal cueing and demonstration  Discussion with patient's partner regarding HEP and plan of care    [facility] PT Assessment - 10/31/20 0001      Balance Screen   Has the patient fallen in the past 6 months No    Has the patient had a decrease in activity level because of a fear of falling?  No      Home Environment   Living Arrangements Spouse/significant other    Available Help at Discharge Family    Type of Home Other(Comment)    Home Access Level entry   [facility] Layout One level      Prior Function   Level of Independence Needs assistance with ADLs;Needs assistance with homemaking;Needs assistance with gait;Needs assistance with transfers      Cognition   Overall Cognitive Status History of cognitive impairments - at baseline    Memory Impaired    Memory Impairment Storage deficit;Retrieval deficit;Decreased recall of new information;Decreased long term memory    Decreased Long Term Memory Verbal basic;Verbal complex;Functional basic;Functional complex    Awareness Impaired    Awareness Impairment Intellectual impairment;Emergent impairment;Anticipatory impairment    Problem Solving Impaired    Problem Solving Impairment Verbal basic;Verbal complex;Functional basic;Functional complex                                 PT Education - 10/31/20 1241    Education Details educated patient's partner on HEP and manual stretching of knees into extension    Person(s) Educated Patient;Spouse    Methods Explanation;Demonstration    Comprehension Verbalized understanding            PT Short Term Goals - 10/31/20 1117      PT SHORT TERM GOAL #1   Title Patient will be independent in home  exercise program to improve strength/mobility for better functional independence with ADLs.    Status New    Target Date 12/26/20             PT Long Term Goals - 10/31/20 1232      PT LONG TERM GOAL #1   Title Patient will increase FOTO score to equal to or greater than 34% to demonstrate statistically significant improvement in mobility and quality of life.    Baseline 10/31/20 10%    Status New    Target Date 12/26/20      PT LONG TERM GOAL #  2   Title Patient with be able to perform bed mobility activities with min A    Baseline 10/31/20 max assist    Status New    Target Date 12/26/20      PT LONG TERM GOAL #3   Title Patient able position himself in the chair into a neutral position with min A    Baseline 10/31/20  max A    Status New    Target Date 12/26/20      PT LONG TERM GOAL #4   Title Patient able to stand from a seated position with mod A for transfers    Baseline 10/31/20 max A x1    Status New    Target Date 12/26/20                 Plan - 10/31/20 1245    Clinical Impression Statement Patient is an 80 year old man presenting in a wheelchair with difficulty staying awake and focusing. The patient has a complex medical history and has had a recent steady decline in ability to perform transitional movements, standing or walk. The patient presents with decreased cognition, poor posture, weakness and stiffness in bilateral LEs.  The patient has limited use of right UE since his stroke.  Patient benefits from skilled PT services to improve LE movement and strength for less assistance with bed mobility and transfers.  Patient is suited for home PT, patient's partner elected for outpatient treatment.    Personal Factors and Comorbidities Age;Comorbidity 3+    Comorbidities Dementia, A-fib, Chronic left MCA and bilateral PCA infarcts    Examination-Activity Limitations Bathing;Reach Overhead;Continence;Bed Mobility;Dressing;Self  Feeding;Hygiene/Grooming;Sit;Toileting;Transfers;Locomotion Level    Examination-Participation Restrictions Shop;Meal Prep;Driving;Medication Management;Cleaning;Laundry;Personal Finances;Yard Work;Interpersonal Relationship    Stability/Clinical Decision Making Unstable/Unpredictable    Clinical Decision Making Moderate    Rehab Potential Fair    PT Frequency 2x / week    PT Duration 8 weeks    PT Treatment/Interventions ADLs/Self Care Home Management;Gait training;Functional mobility training;Therapeutic activities;Therapeutic exercise;Balance training;Neuromuscular re-education;Patient/family education;Manual techniques;Passive range of motion    PT Next Visit Plan seated or supine strengthening    PT Home Exercise Plan educated patient's partner on stretching patient's knees into extension    Consulted and Agree with Plan of Care Family member/caregiver;Patient    Family Member Consulted patient's spouse           Patient will benefit from skilled therapeutic intervention in order to improve the following deficits and impairments:  Abnormal gait,Decreased cognition,Decreased range of motion,Decreased strength,Impaired UE functional use,Pain,Decreased balance,Decreased mobility,Difficulty walking,Impaired flexibility,Impaired tone,Impaired vision/preception,Postural dysfunction  Visit Diagnosis: Muscle weakness (generalized)     Problem List Patient Active Problem List   Diagnosis Date Noted  . CAP (community acquired pneumonia) 07/26/2020  . CVA (cerebral vascular accident) (HCC) 07/26/2020  . Dementia (HCC) 07/26/2020  . Encephalopathy acute 07/26/2020    Colin Mulders PT, DPT 10/31/2020, 1:01 PM  [place] [facility] MAIN [facility] SERVICES [address] [place], [place], [address] Phone: [phone]   Fax:  [phone]  Name: Duane Guzman MRN: [id number] Date of Birth: 05-14-1941

## 2020-11-05 ENCOUNTER — Ambulatory Visit

## 2020-11-07 ENCOUNTER — Ambulatory Visit

## 2020-11-12 ENCOUNTER — Ambulatory Visit

## 2020-11-14 ENCOUNTER — Ambulatory Visit: Payer: Medicare Other

## 2020-11-21 ENCOUNTER — Ambulatory Visit: Payer: Medicare Other

## 2020-11-28 ENCOUNTER — Ambulatory Visit: Payer: Medicare Other | Admitting: Physical Therapy

## 2020-12-28 DEATH — deceased

## 2021-12-27 IMAGING — CT CT ABD-PELV W/O CM
2 of 4 series · 16 of 46 positions shown, 18 images · non-contrast
Comparison: None.

CLINICAL DATA: Acute nonlocalized abdominal pain.  Weakness.

EXAM:
CT ABDOMEN AND PELVIS WITHOUT CONTRAST
TECHNIQUE: Multidetector CT imaging of the abdomen and pelvis was performed
following the standard protocol without IV contrast.

[Series 4: routine abd/pel wo · axial · 0.72mm/px · z∈[-822,-387]mm · 13 of 95 slices shown, 15 images]
[im 4/95  soft-tissue]
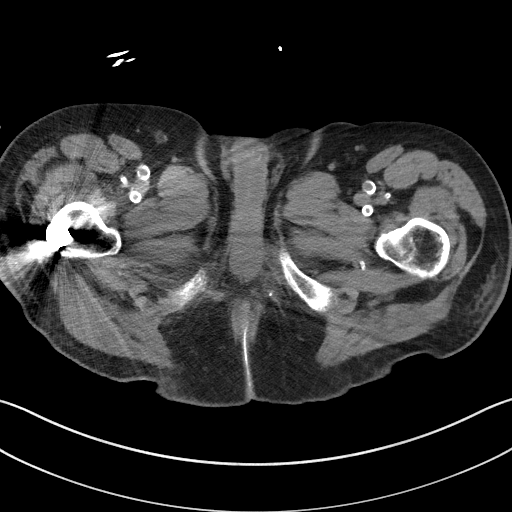
[im 4/95  bone]
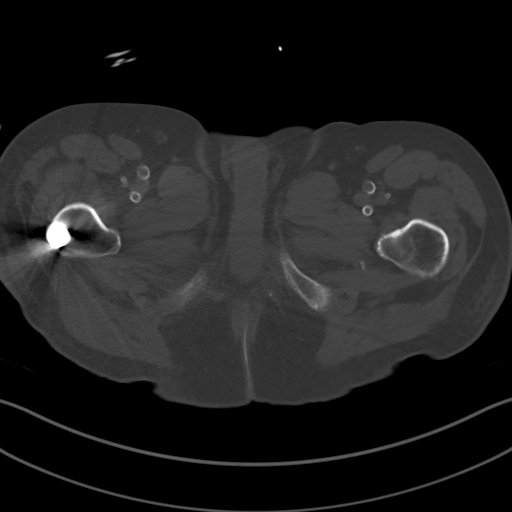
[im 12/95  soft-tissue]
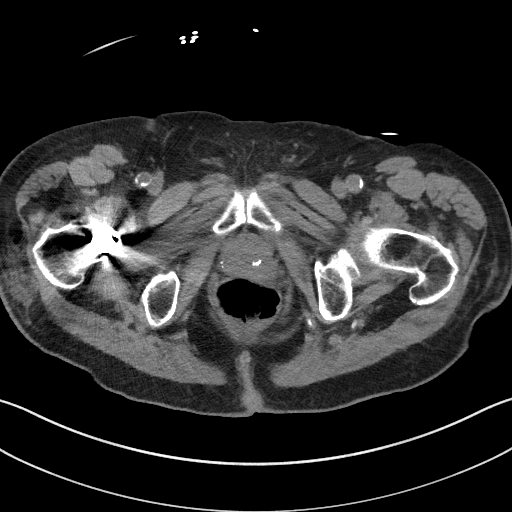
[im 19/95  soft-tissue]
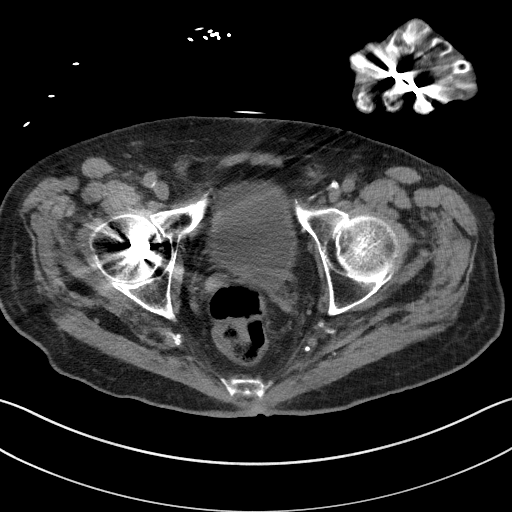
[im 27/95  soft-tissue]
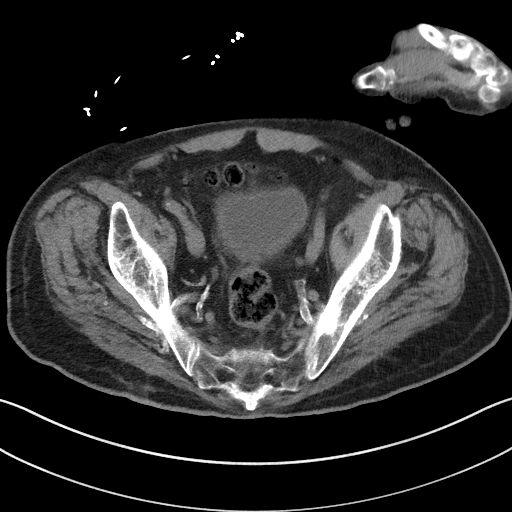
[im 34/95  soft-tissue]
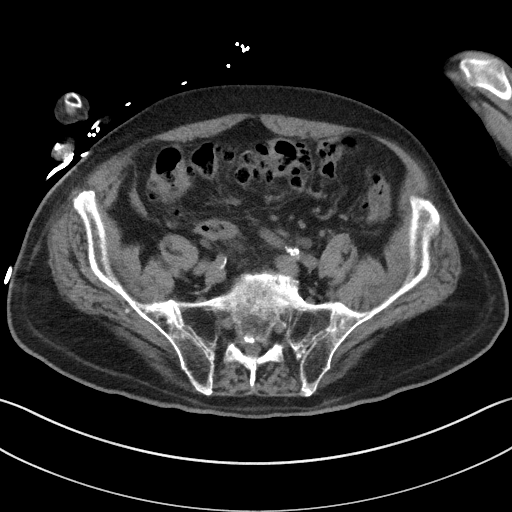
[im 42/95  soft-tissue]
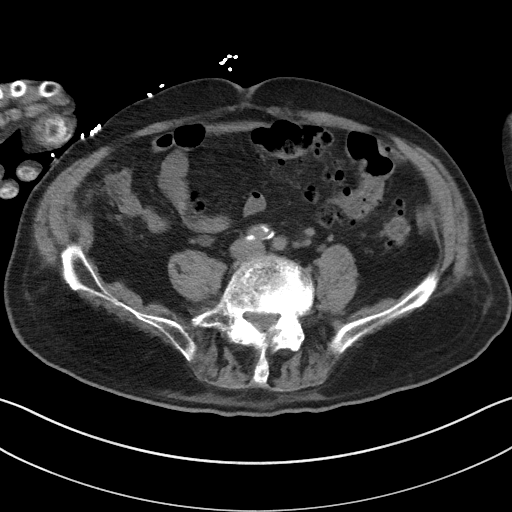
[im 49/95  soft-tissue]
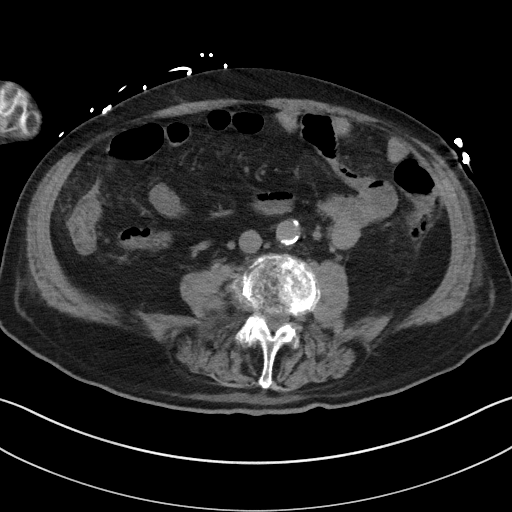
[im 53/95  soft-tissue]
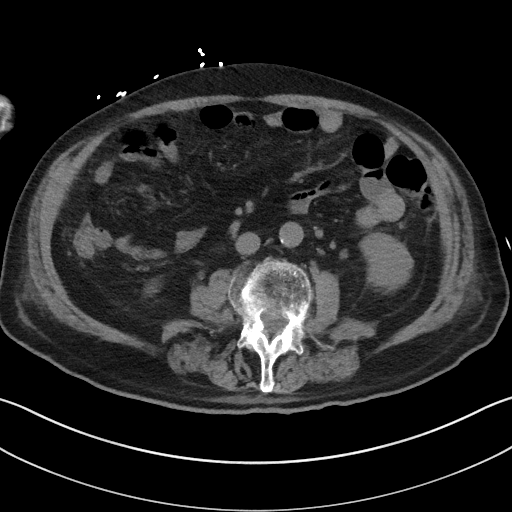
[im 61/95  soft-tissue]
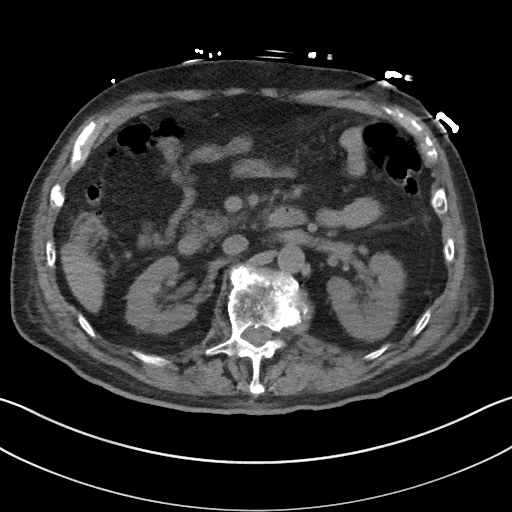
[im 61/95  bone]
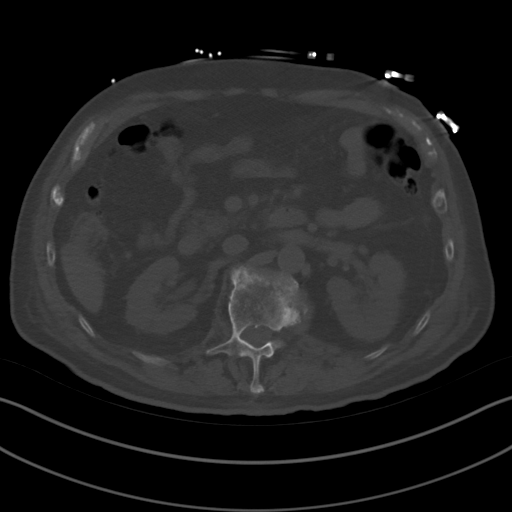
[im 68/95  soft-tissue]
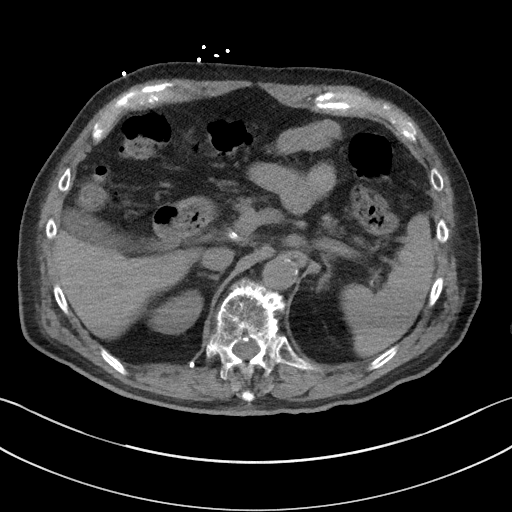
[im 76/95  soft-tissue]
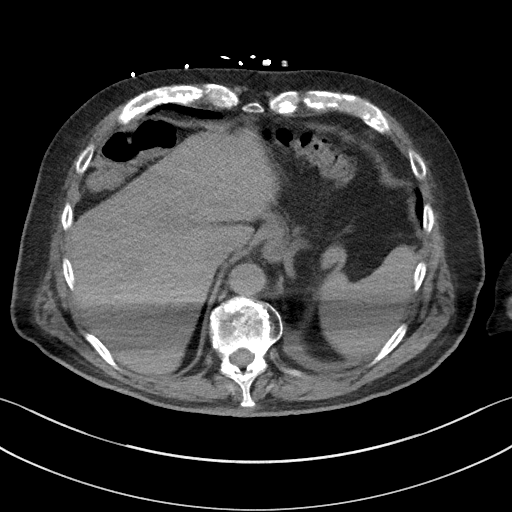
[im 83/95  soft-tissue]
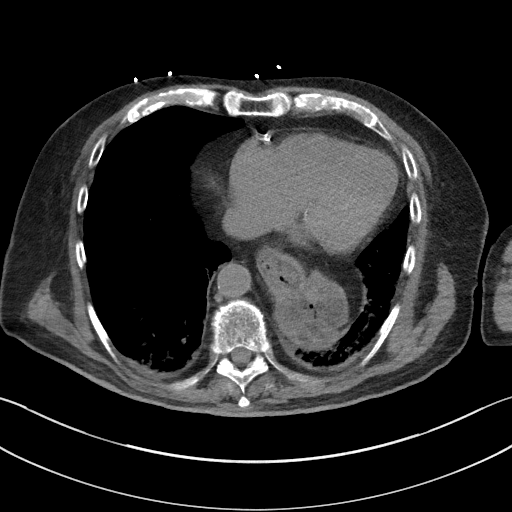
[im 91/95  soft-tissue]
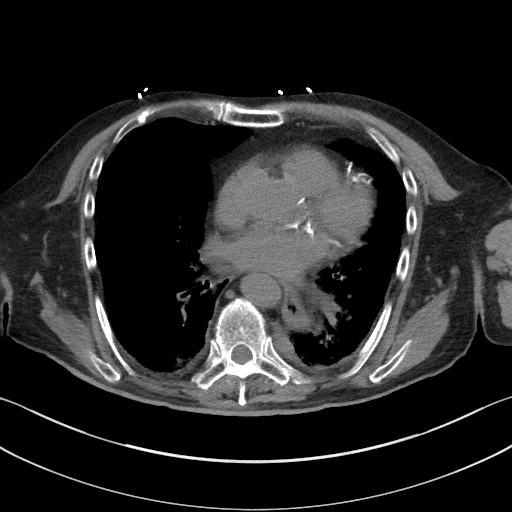

[Series 6: coronal st · coronal · 0.79mm/px · 3 of 88 slices shown]
[im 30/88  soft-tissue]
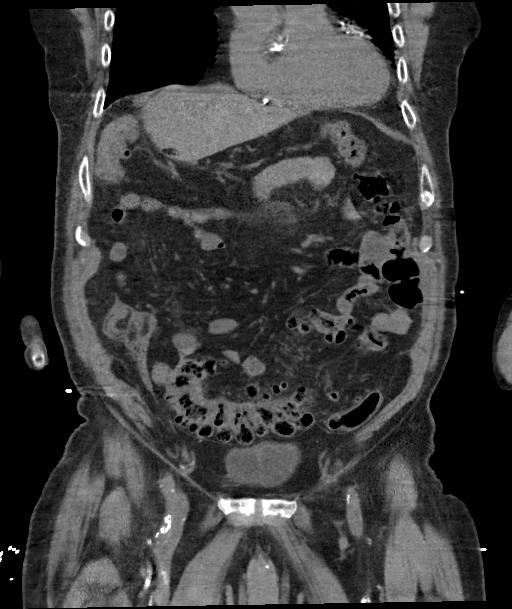
[im 39/88  soft-tissue]
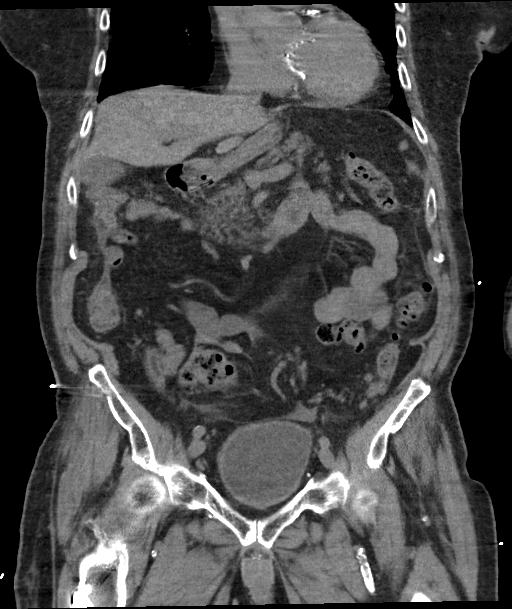
[im 49/88  soft-tissue]
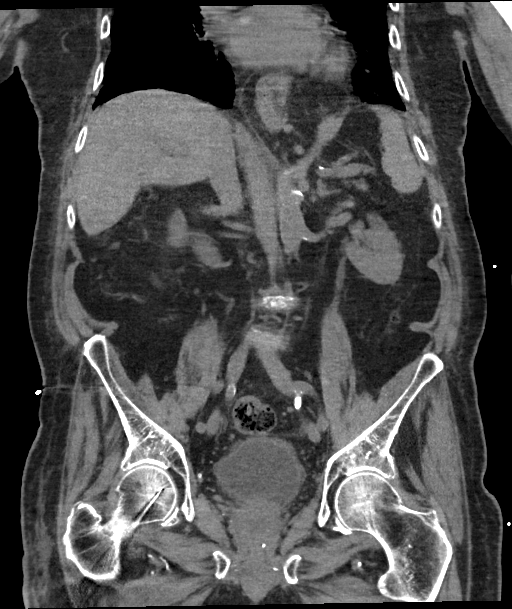

[16 of 46 positions shown; findings below may reference images not displayed]

FINDINGS: Lower chest: Patchy atelectasis and or infiltrate in both lower
lungs. Small amount of pleural fluid left more than right. Extensive
coronary artery calcification. Hiatal hernia containing fat and
portions of the stomach.

Hepatobiliary: Normal without contrast.

Pancreas: Normal

Spleen: Normal

Adrenals/Urinary Tract: Adrenal glands are normal. Kidneys are
normal except for a simple appearing cyst at the lower pole on the
right measuring 2.5 cm in diameter. No hydronephrosis, stone or
mass. Small bladder diverticula.

Stomach/Bowel: Hiatal hernia as noted above containing portions of
the stomach. No sign of obstruction. No small bowel pathology is
evident. No acute or significant: Finding. Diverticulosis without CT
evidence of diverticulitis.

Vascular/Lymphatic: Aortic atherosclerosis. No aneurysm. IVC is
normal. No retroperitoneal adenopathy.

Reproductive: Normal

Other: Small amount of intraperitoneal fluid without loculation. No
free air.

Musculoskeletal: Chronic curvature in degenerative changes of the
spine.
IMPRESSION: 1. Patchy atelectasis and or infiltrate in both lower lungs. Small
amount of pleural fluid left more than right.
2. Hiatal hernia containing fat and portions of the stomach. No sign
of obstruction.
3. Aortic atherosclerosis. Coronary artery calcification.
4. Diverticulosis without CT evidence of diverticulitis. Low level
diverticulitis can be inapparent at imaging.
5. Small amount of intraperitoneal fluid without loculation.

Aortic Atherosclerosis ([id number]).
